# Patient Record
Sex: Female | Born: 1970
Health system: Southern US, Community
[De-identification: ages and names within clinical notes are randomized; demographics above are authoritative.]

## PROBLEM LIST (undated history)

## (undated) DIAGNOSIS — E039 Hypothyroidism, unspecified: Secondary | ICD-10-CM

## (undated) DIAGNOSIS — O039 Complete or unspecified spontaneous abortion without complication: Secondary | ICD-10-CM

## (undated) DIAGNOSIS — K759 Inflammatory liver disease, unspecified: Secondary | ICD-10-CM

## (undated) HISTORY — PX: WISDOM TOOTH EXTRACTION: SHX21

---

## 2011-05-24 ENCOUNTER — Other Ambulatory Visit (HOSPITAL_COMMUNITY): Payer: Self-pay | Admitting: Obstetrics and Gynecology

## 2011-05-24 ENCOUNTER — Ambulatory Visit (HOSPITAL_COMMUNITY)
Admission: RE | Admit: 2011-05-24 | Discharge: 2011-05-24 | Disposition: A | Discharge status: Home / Self Care | Payer: Commercial Managed Care - PPO | Source: Ambulatory Visit | Attending: Obstetrics and Gynecology | Admitting: Obstetrics and Gynecology

## 2011-05-24 ENCOUNTER — Encounter (HOSPITAL_COMMUNITY): Payer: Self-pay

## 2011-05-24 DIAGNOSIS — O2690 Pregnancy related conditions, unspecified, unspecified trimester: Secondary | ICD-10-CM

## 2011-05-24 DIAGNOSIS — O358XX0 Maternal care for other (suspected) fetal abnormality and damage, not applicable or unspecified: Secondary | ICD-10-CM

## 2011-05-24 DIAGNOSIS — O34219 Maternal care for unspecified type scar from previous cesarean delivery: Secondary | ICD-10-CM | POA: Insufficient documentation

## 2011-05-24 DIAGNOSIS — E079 Disorder of thyroid, unspecified: Secondary | ICD-10-CM | POA: Insufficient documentation

## 2011-05-24 DIAGNOSIS — O99280 Endocrine, nutritional and metabolic diseases complicating pregnancy, unspecified trimester: Secondary | ICD-10-CM | POA: Insufficient documentation

## 2011-05-24 DIAGNOSIS — O09529 Supervision of elderly multigravida, unspecified trimester: Secondary | ICD-10-CM | POA: Insufficient documentation

## 2011-06-08 NOTE — H&P (Signed)
40 year old G4 P58 at 46 w3 d presents for D and E with chromosones. This patient had an abnormal first trimester ultrasound.  A large omphalocele, cystic hygroma, pleural effusions, and hydrops was noted. The patient had an ultrasound with MFM which confirmed the findings. CVS was offered but the patient elected for termination after extensive counselling by MFM.  The patient and her husband have chosen to have tissue sent for karyotype.  Med hx Unremarkable  surg hx Cesarean section x 2  meds  None All Codeine  Af vss gen alert and oriented Lung CTAb Car RRR Abd soft and non tender Cervix closed 1 laminaria placed  IMP IUP at 14  Multiple anomalies  PLAN D and E Ultrasound guidance Karyotype Risks of uterineperforation, retained tissue discussed with the patient

## 2011-06-09 ENCOUNTER — Encounter (HOSPITAL_COMMUNITY): Payer: Self-pay | Admitting: Anesthesiology

## 2011-06-09 ENCOUNTER — Encounter (HOSPITAL_COMMUNITY): Payer: Self-pay | Admitting: *Deleted

## 2011-06-09 ENCOUNTER — Encounter (HOSPITAL_COMMUNITY)
Admission: RE | Disposition: A | Discharge status: Home / Self Care | Payer: Self-pay | Source: Ambulatory Visit | Attending: Obstetrics and Gynecology

## 2011-06-09 ENCOUNTER — Ambulatory Visit (HOSPITAL_COMMUNITY): Payer: 59 | Admitting: Anesthesiology

## 2011-06-09 ENCOUNTER — Other Ambulatory Visit: Payer: Self-pay | Admitting: Obstetrics and Gynecology

## 2011-06-09 ENCOUNTER — Ambulatory Visit (HOSPITAL_COMMUNITY)
Admission: RE | Admit: 2011-06-09 | Discharge: 2011-06-09 | Disposition: A | Discharge status: Home / Self Care | Payer: 59 | Source: Ambulatory Visit | Attending: Obstetrics and Gynecology | Admitting: Obstetrics and Gynecology

## 2011-06-09 ENCOUNTER — Ambulatory Visit (HOSPITAL_COMMUNITY): Payer: 59

## 2011-06-09 DIAGNOSIS — O039 Complete or unspecified spontaneous abortion without complication: Secondary | ICD-10-CM | POA: Insufficient documentation

## 2011-06-09 DIAGNOSIS — IMO0002 Reserved for concepts with insufficient information to code with codable children: Secondary | ICD-10-CM

## 2011-06-09 DIAGNOSIS — O351XX0 Maternal care for (suspected) chromosomal abnormality in fetus, not applicable or unspecified: Secondary | ICD-10-CM | POA: Insufficient documentation

## 2011-06-09 DIAGNOSIS — O3510X0 Maternal care for (suspected) chromosomal abnormality in fetus, unspecified, not applicable or unspecified: Secondary | ICD-10-CM | POA: Insufficient documentation

## 2011-06-09 HISTORY — PX: DILATION AND EVACUATION: SHX1459

## 2011-06-09 HISTORY — DX: Hypothyroidism, unspecified: E03.9

## 2011-06-09 HISTORY — DX: Inflammatory liver disease, unspecified: K75.9

## 2011-06-09 HISTORY — DX: Complete or unspecified spontaneous abortion without complication: O03.9

## 2011-06-09 LAB — CBC
Hemoglobin: 12 g/dL (ref 12.0–15.0)
MCH: 26 pg (ref 26.0–34.0)
MCHC: 32.4 g/dL (ref 30.0–36.0)
RDW: 15.1 % (ref 11.5–15.5)

## 2011-06-09 SURGERY — DILATION AND EVACUATION, UTERUS, SECOND TRIMESTER
Anesthesia: Choice | Site: Vagina | Wound class: Clean Contaminated

## 2011-06-09 MED ORDER — PROPOFOL 10 MG/ML IV EMUL
INTRAVENOUS | Status: AC
  Filled 2011-06-09: qty 20

## 2011-06-09 MED ORDER — LIDOCAINE HCL (CARDIAC) 20 MG/ML IV SOLN
INTRAVENOUS | Status: AC
  Filled 2011-06-09: qty 5

## 2011-06-09 MED ORDER — LACTATED RINGERS IV SOLN
INTRAVENOUS | Status: DC
  Administered 2011-06-09: 1000 mL via INTRAVENOUS
  Administered 2011-06-09: 07:00:00 via INTRAVENOUS

## 2011-06-09 MED ORDER — ONDANSETRON HCL 4 MG/2ML IJ SOLN: 4.0000 mg | Freq: Once | INTRAMUSCULAR | Status: DC | PRN

## 2011-06-09 MED ORDER — FENTANYL CITRATE 0.05 MG/ML IJ SOLN
25.0000 ug | INTRAMUSCULAR | Status: DC | PRN
  Administered 2011-06-09: 50 ug via INTRAVENOUS

## 2011-06-09 MED ORDER — CEFAZOLIN SODIUM 1-5 GM-% IV SOLN
INTRAVENOUS | Status: AC
  Filled 2011-06-09: qty 50

## 2011-06-09 MED ORDER — ONDANSETRON HCL 4 MG/2ML IJ SOLN
INTRAMUSCULAR | Status: DC | PRN
  Administered 2011-06-09: 4 mg via INTRAVENOUS

## 2011-06-09 MED ORDER — MIDAZOLAM HCL 5 MG/5ML IJ SOLN
INTRAMUSCULAR | Status: DC | PRN
  Administered 2011-06-09: 2 mg via INTRAVENOUS

## 2011-06-09 MED ORDER — PROPOFOL 10 MG/ML IV EMUL
INTRAVENOUS | Status: DC | PRN
  Administered 2011-06-09 (×8): 20 mg via INTRAVENOUS

## 2011-06-09 MED ORDER — MEPERIDINE HCL 25 MG/ML IJ SOLN: 6.2500 mg | INTRAMUSCULAR | Status: DC | PRN

## 2011-06-09 MED ORDER — LIDOCAINE HCL (CARDIAC) 20 MG/ML IV SOLN
INTRAVENOUS | Status: DC | PRN
  Administered 2011-06-09: 50 mg via INTRAVENOUS

## 2011-06-09 MED ORDER — CEFAZOLIN SODIUM 1-5 GM-% IV SOLN
1.0000 g | INTRAVENOUS | Status: AC
  Administered 2011-06-09: 1 g via INTRAVENOUS

## 2011-06-09 MED ORDER — LIDOCAINE HCL 1 % IJ SOLN
INTRAMUSCULAR | Status: DC | PRN
  Administered 2011-06-09: 10 mL

## 2011-06-09 MED ORDER — FENTANYL CITRATE 0.05 MG/ML IJ SOLN
INTRAMUSCULAR | Status: AC
  Filled 2011-06-09: qty 2

## 2011-06-09 MED ORDER — ONDANSETRON 4 MG PO TBDP
4.0000 mg | ORAL_TABLET | Freq: Once | ORAL | Status: AC
  Administered 2011-06-09: 4 mg via ORAL

## 2011-06-09 MED ORDER — ACETAMINOPHEN 325 MG PO TABS: 325.0000 mg | ORAL_TABLET | ORAL | Status: DC | PRN

## 2011-06-09 MED ORDER — ONDANSETRON HCL 4 MG/2ML IJ SOLN
INTRAMUSCULAR | Status: AC
  Filled 2011-06-09: qty 2

## 2011-06-09 MED ORDER — ONDANSETRON 4 MG PO TBDP
ORAL_TABLET | ORAL | Status: AC
  Filled 2011-06-09: qty 1

## 2011-06-09 MED ORDER — FENTANYL CITRATE 0.05 MG/ML IJ SOLN
INTRAMUSCULAR | Status: DC | PRN
  Administered 2011-06-09 (×2): 25 ug via INTRAVENOUS
  Administered 2011-06-09: 50 ug via INTRAVENOUS

## 2011-06-09 MED ORDER — MIDAZOLAM HCL 2 MG/2ML IJ SOLN
INTRAMUSCULAR | Status: AC
  Filled 2011-06-09: qty 2

## 2011-06-09 SURGICAL SUPPLY — 16 items
CLOTH BEACON ORANGE TIMEOUT ST (SAFETY) ×2 IMPLANT
CONT PATH 16OZ SNAP LID 3702 (MISCELLANEOUS) IMPLANT
DRAPE HYSTEROSCOPY (DRAPE) ×2 IMPLANT
GLOVE BIO SURGEON STRL SZ 6.5 (GLOVE) ×4 IMPLANT
GOWN STRL NON-REIN LRG LVL3 (GOWN DISPOSABLE) ×4 IMPLANT
KIT BERKELEY 2ND TRIMESTER 1/2 (COLLECTOR) ×2 IMPLANT
NEEDLE SPNL 22GX3.5 QUINCKE BK (NEEDLE) ×2 IMPLANT
NS IRRIG 1000ML POUR BTL (IV SOLUTION) ×2 IMPLANT
PACK VAGINAL MINOR WOMEN LF (CUSTOM PROCEDURE TRAY) ×2 IMPLANT
PAD PREP 24X48 CUFFED NSTRL (MISCELLANEOUS) ×2 IMPLANT
SYR CONTROL 10ML LL (SYRINGE) ×2 IMPLANT
TOWEL OR 17X24 6PK STRL BLUE (TOWEL DISPOSABLE) ×4 IMPLANT
TUBE VACURETTE 2ND TRIMESTER (CANNULA) ×2 IMPLANT
VACURETTE 12 RIGID CVD (CANNULA) ×2 IMPLANT
VACURETTE 14MM CVD 1/2 BASE (CANNULA) IMPLANT
VACURETTE 16MM ASPIR CVD .5 (CANNULA) IMPLANT

## 2011-06-09 NOTE — Transfer of Care (Signed)
Immediate Anesthesia Transfer of Care Note  Patient: Abigail Goodwin  Procedure(s) Performed:  DILATATION AND EVACUATION (D&E) 2ND TRIMESTER - pt is 13 weeks/ultrasound and chromosomes  Patient Location: PACU  Anesthesia Type: MAC  Level of Consciousness: awake, alert  and sedated  Airway & Oxygen Therapy: Patient Spontanous Breathing  Post-op Assessment: Report given to PACU RN and Post -op Vital signs reviewed and stable  Post vital signs: Reviewed and stable  Complications: No apparent anesthesia complications

## 2011-06-09 NOTE — Brief Op Note (Signed)
06/09/2011  7:49 AM  PATIENT:  Abigail Goodwin  40 y.o. female  PRE-OPERATIVE DIAGNOSIS:   1. Cystic Hygroma 2. Omphalocele 3. Pleural effusions 4. Hydrops 5. IUP at 14 weeks 6. Previous c-section x 2  POST-OPERATIVE DIAGNOSIS:   1. Same 2. IUFD  PROCEDURE:  Procedure(s): DILATATION AND EVACUATION (D&E) 2ND TRIMESTER With ultrasound guidance Karyotype  SURGEON:  Surgeon(s): Jeani Hawking, MD  PHYSICIAN ASSISTANT:   ASSISTANTS: none   ANESTHESIA:   LMA  OR FLUID I/O:  Total I/O In: 800 [I.V.:800] Out: 125 [Urine:100; Blood:25]  BLOOD ADMINISTERED:none  DRAINS: none   LOCAL MEDICATIONS USED:  MARCAINE 10 CC  SPECIMEN:  Source of Specimen:  POC  DISPOSITION OF SPECIMEN:  PATHOLOGY  COUNTS:  YES  TOURNIQUET:  * No tourniquets in log *  DICTATION: .Other Dictation: Dictation Number [id number]  PLAN OF CARE: Discharge to home after PACU  PATIENT DISPOSITION:  PACU - hemodynamically stable.   Delay start of Pharmacological VTE agent (>24hrs) due to surgical blood loss or risk of bleeding:  not applicable

## 2011-06-09 NOTE — Anesthesia Preprocedure Evaluation (Addendum)
Anesthesia Evaluation Anesthesia Physical Anesthesia Plan  ASA: II  Anesthesia Plan: MAC   Post-op Pain Management:    Induction: Intravenous  Airway Management Planned: Oral ETT  Additional Equipment:   Intra-op Plan:   Post-operative Plan:   Informed Consent: I have reviewed the patients History and Physical, chart, labs and discussed the procedure including the risks, benefits and alternatives for the proposed anesthesia with the patient or authorized representative who has indicated his/her understanding and acceptance.   Dental Advisory Given  Plan Discussed with: CRNA and Surgeon  Anesthesia Plan Comments: (  Discussed  general anesthesia, including possible nausea, instrumentation of airway, sore throat,pulmonary aspiration, etc. I asked if the were any outstanding questions, or  concerns before we proceeded. )        Anesthesia Quick Evaluation

## 2011-06-09 NOTE — Op Note (Signed)
NAME:  Abigail Goodwin, Abigail Goodwin NO.:  [id number]  MEDICAL RECORD NO.:  [id number]  LOCATION:  [facility]                          [facility]ACILITY:  [facility]  PHYSICIAN:  Zebulan Hinshaw L. Kele Withem, M.D.DATE OF BIRTH:  1971/05/08  DATE OF PROCEDURE: DATE OF DISCHARGE:                              OPERATIVE REPORT   PREOPERATIVE DIAGNOSES: 1. Intrauterine pregnancy at 14 weeks. 2. Cystic hygroma. 3. Omphalocele. 4. Pleural effusions. 5. Hydrops. 6. Previous cesarean section x2.  PREOPERATIVE DIAGNOSES: 1. Intrauterine pregnancy at 14 weeks. 2. Intrauterine fetal death. 3. Cystic hygroma. 4. Omphalocele. 5. Pleural effusions. 6. Hydrops. 7. Previous cesarean section x2.  PROCEDURE:  Dilatation and evacuation in second trimester with ultrasound guidance and tissue sent for karyotype.  SURGEON:  Paolo Okane L. Vincente Poli, MD  ANESTHESIA:  LMA.  ESTIMATED BLOOD LOSS:  Approximately 200 mL.  PROCEDURE:  The patient was taken to the operating room.  Ultrasound was present from start to the end of the case.  She was prepped and draped in usual sterile fashion.  In-and-out catheter was used to empty the bladder, speculum was inserted into the vagina.  Prior to prepping, we did remove one sponge and one laminaria.  Her cervix was already 1 cm dilated from the laminaria that I placed yesterday.  A paracervical block was performed in a standard fashion.  Using the ultrasound, we first looked at the fetus.  There was no heart beat consistent with an IUFD.  I then slowly dilated under ultrasound guidance.  It was very easy because the cervix was soft and already dilated from the laminaria placed.  We then inserted a #12 suction cannula and performed a suction curettage.  Everything was done directly with ultrasound guidance.  All tissue was then removed except for the baby's skull.  I then used the forceps, I was able to decompress the skull and then reinserted the suction cannula and performed a  suction curettage.  All tissue was then noted to be removed.  A sharp curette was inserted, the uterus was thoroughly curetted of all tissue and the endometrium was clean.  The cavity was clean.  No bleeding was noted.  All instruments were removed from the vagina.  All sponge, lap, and instrument counts were correct x2.  She will go to recovery room in stable condition.     Belicia Difatta L. Vincente Poli, M.D.     Florestine Avers  D:  06/09/2011  T:  06/09/2011  Job:  [id number]

## 2011-06-09 NOTE — Progress Notes (Signed)
H and P on chart No significant changes Laminaria remained in place overnight. Will proceed with D and E with ultrasound guidance / chromosone analysis

## 2011-06-09 NOTE — Anesthesia Postprocedure Evaluation (Signed)
Anesthesia Post Note  Patient: Abigail Goodwin  Procedure(s) Performed:  DILATATION AND EVACUATION (D&E) 2ND TRIMESTER - 14 weeks/ultrasound and chromosome studies  Anesthesia type: MAC  Patient location: PACU  Post pain: Pain level controlled  Post assessment: Post-op Vital signs reviewed  Last Vitals:  Filed Vitals:   06/09/11 0750  BP: 111/40  Pulse: 84  Temp: 97.6 F (36.4 C)  Resp: 20    Post vital signs: Reviewed  Level of consciousness: sedated  Complications: No apparent anesthesia complications

## 2011-06-15 ENCOUNTER — Encounter (HOSPITAL_COMMUNITY): Payer: Self-pay | Admitting: Obstetrics and Gynecology

## 2011-08-12 ENCOUNTER — Inpatient Hospital Stay (HOSPITAL_COMMUNITY): Payer: 59

## 2011-08-12 ENCOUNTER — Other Ambulatory Visit: Payer: Self-pay | Admitting: Obstetrics and Gynecology

## 2011-08-12 ENCOUNTER — Encounter (HOSPITAL_COMMUNITY): Payer: Self-pay

## 2011-08-12 ENCOUNTER — Inpatient Hospital Stay (HOSPITAL_COMMUNITY)
Admission: AD | Admit: 2011-08-12 | Discharge: 2011-08-12 | Disposition: A | Discharge status: Home / Self Care | Payer: 59 | Source: Ambulatory Visit | Attending: Obstetrics and Gynecology | Admitting: Obstetrics and Gynecology

## 2011-08-12 DIAGNOSIS — N949 Unspecified condition associated with female genital organs and menstrual cycle: Secondary | ICD-10-CM | POA: Insufficient documentation

## 2011-08-12 DIAGNOSIS — N938 Other specified abnormal uterine and vaginal bleeding: Secondary | ICD-10-CM | POA: Insufficient documentation

## 2011-08-12 DIAGNOSIS — R109 Unspecified abdominal pain: Secondary | ICD-10-CM | POA: Insufficient documentation

## 2011-08-12 LAB — CBC
HCT: 40.6 % (ref 36.0–46.0)
Hemoglobin: 13.7 g/dL (ref 12.0–15.0)
MCV: 80.9 fL (ref 78.0–100.0)
RBC: 5.02 MIL/uL (ref 3.87–5.11)
WBC: 13.7 10*3/uL — ABNORMAL HIGH (ref 4.0–10.5)

## 2011-08-12 LAB — HCG, QUANTITATIVE, PREGNANCY: hCG, Beta Chain, Quant, S: <1 m[IU]/mL (ref <5)

## 2011-08-12 MED ORDER — ACETAMINOPHEN 325 MG PO TABS: 650.0000 mg | ORAL_TABLET | Freq: Four times a day (QID) | ORAL | Status: DC | PRN

## 2011-08-12 MED ORDER — AMOXICILLIN-POT CLAVULANATE 875-125 MG PO TABS: 1.0000 | ORAL_TABLET | Freq: Two times a day (BID) | ORAL | Status: AC

## 2011-08-12 NOTE — ED Provider Notes (Signed)
History   40yo G4 P2 with LMP now and presents c/o increased cramping yesterday and bleeding and passed some "tissue".   She reports bleeding that started on 11/17 for 2 days and then it restarted.  She denies any other symptoms.  Chief Complaint  Patient presents with  . Abdominal Cramping  . Vaginal Bleeding   HPI  OB History    Grav Para Term Preterm Abortions TAB SAB Ect Mult Living   4 2 2  0 1 0 1 0 0 2      Past Medical History  Diagnosis Date  . Hypothyroidism   . Miscarriage   . Hepatitis     hx at age 34yrs old    Past Surgical History  Procedure Date  . Cesarean section     x2  . Wisdom tooth extraction   . Dilation and evacuation 06/09/2011    Procedure: DILATATION AND EVACUATION (D&E) 2ND TRIMESTER;  Surgeon: Jeani Hawking, MD;  Location: [facility];  Service: Gynecology;  Laterality: N/A;  14 weeks/ultrasound and chromosome studies    No family history on file.  History  Substance Use Topics  . Smoking status: Never Smoker   . Smokeless tobacco: Never Used  . Alcohol Use: No    Allergies:  Allergies  Allergen Reactions  . Advil Swelling    Swelling/rash on face    Prescriptions prior to admission  Medication Sig Dispense Refill  . Levothyroxine Sodium (SYNTHROID PO) Take 1 tablet by mouth daily.       Marland Kitchen oxyCODONE-acetaminophen (PERCOCET) 5-325 MG per tablet Take 1 tablet by mouth every 4 (four) hours as needed. For pain.      . prenatal vitamin w/FE, FA (PRENATAL 1 + 1) 27-1 MG TABS Take 1 tablet by mouth daily.        Marland Kitchen DISCONTD: acetaminophen (TYLENOL) 325 MG tablet Take 650 mg by mouth every 6 (six) hours as needed. For pain.         ROS Cough and recent URI.  Denies fever, chills, n/v.  Physical Exam   Blood pressure 108/74, pulse 72, temperature 98.6 F (37 C), temperature source Oral, resp. rate 16, height 5' 2.5" (1.588 m), weight 70.308 kg (155 lb), last menstrual period 08/12/2011, unknown if currently breastfeeding.  Physical  Exam HEENT throat no erythema and TMs wnl Lungs CTA bil CV RRR Abd soft, NT Pelvic Exam: cervix closed and long, minimal bleeding, uterus wnl and nontender and no palpable adnexal masses, nt  MAU Course  Procedures Ultrasound - negative with normal endometrium or signs of Retained POCs CBC Quant Tissue sent to path and to [facility] for chromosome analysis   Assessment and Plan  40yo P2 s/p D&C end of September with recent increased cramping and abnormal bleeding and possible passing of tissue vs clot.  No evidence of retained POCs on ultrasound or exam. WBC is elevated but no obvious signs of endometritis.  D/w pt and she feels comfortable taking prescription of antibiotic as a precaution. Rx Augmentin given Precautions reviewed F/u in office in 4 weeks - by that time the chromosome results will have returned  Ariyona Eid Y 08/12/2011, 1:18 PM

## 2011-08-12 NOTE — Progress Notes (Signed)
Onset of abdominal cramping last night  Had D&D on 9/21 this morning passed tissue ?embryo patient brought with her.

## 2011-08-12 NOTE — ED Notes (Signed)
Dr. Su Hilt called to clarify orders. Pt states specimen she brought from home was to be send for chromosome analysis. Dr. Su Hilt clarified orders and states to send small portion of specimen in formalin to lab for products  of conception. Large portion of specimen to be sent for Chromosome analysis. Abigail Goodwin to lab to clarify orders; formalin added to small specimen and large specimen added to saline container provided by the lab tech. Form filled out of chromosome specimen and signed by patient. Dr. Su Hilt notified that specimen could not be send until signed by physician. Dr. Su Hilt aware and states she will be by to sign the form.

## 2011-08-12 NOTE — ED Notes (Signed)
Dr. Su Hilt on unit. Signed form for Chromosomal analysis. Pt in [place] will notify Dr. Su Hilt when she returns.

## 2012-10-21 IMAGING — US US OB COMP LESS 14 WK
1 series · 14 of 28 positions shown · non-contrast
Comparison: none

[Series 1: us ob comp less 14 wk · 0.17mm/px · 14 of 74 slices shown]
[im 3/74]
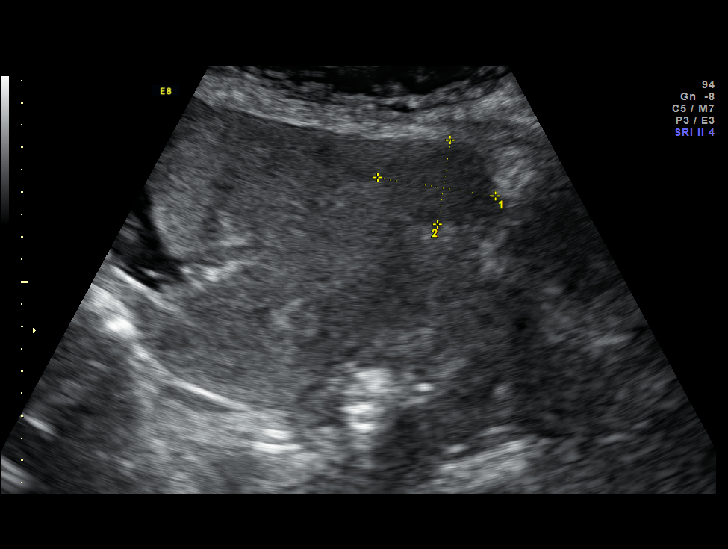
[im 9/74]
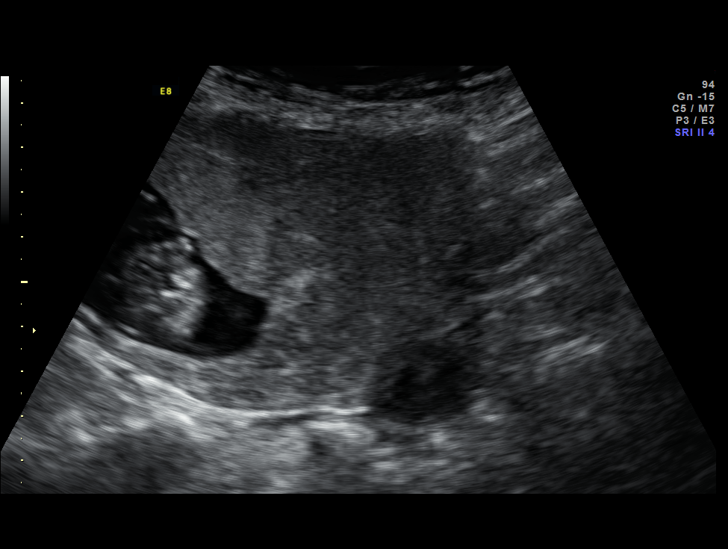
[im 14/74]
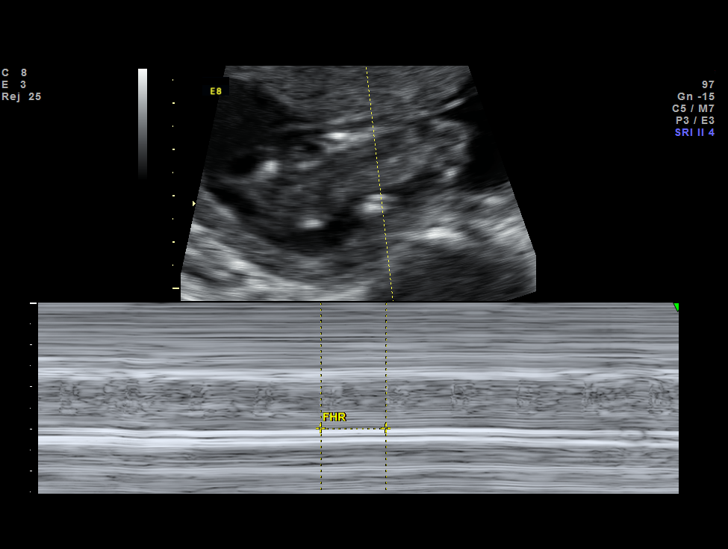
[im 19/74]
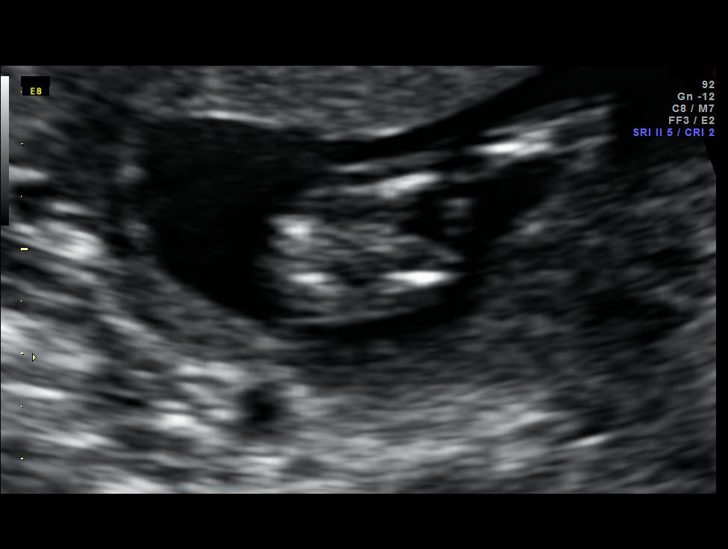
[im 25/74]
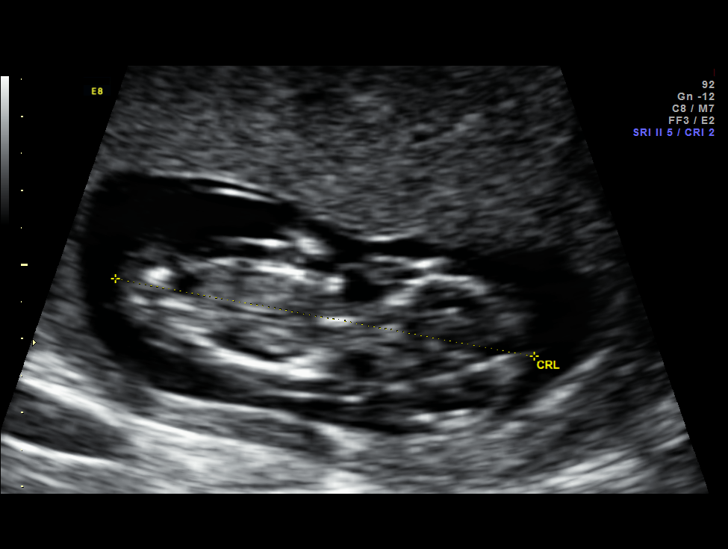
[im 30/74]
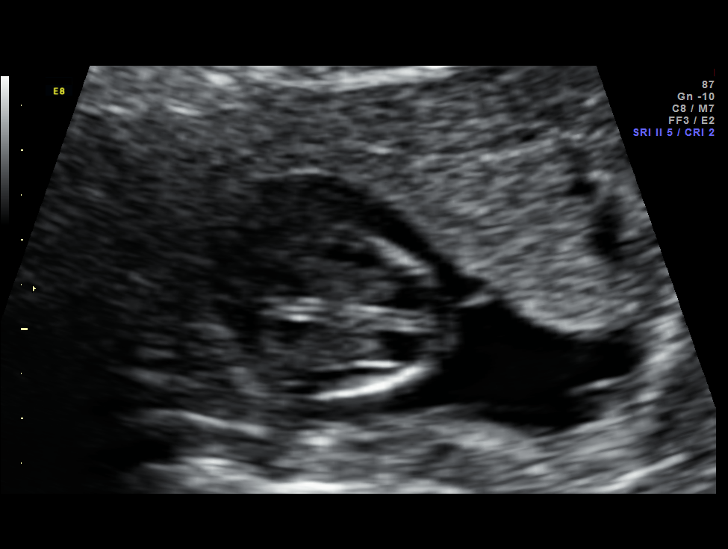
[im 36/74]
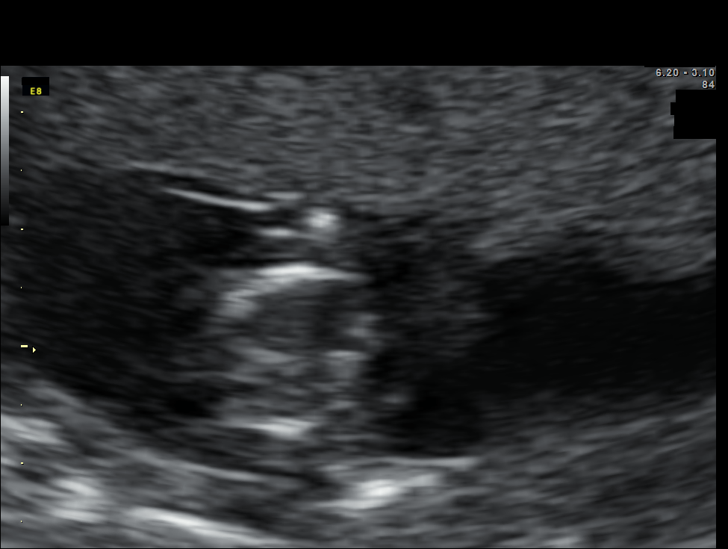
[im 41/74]
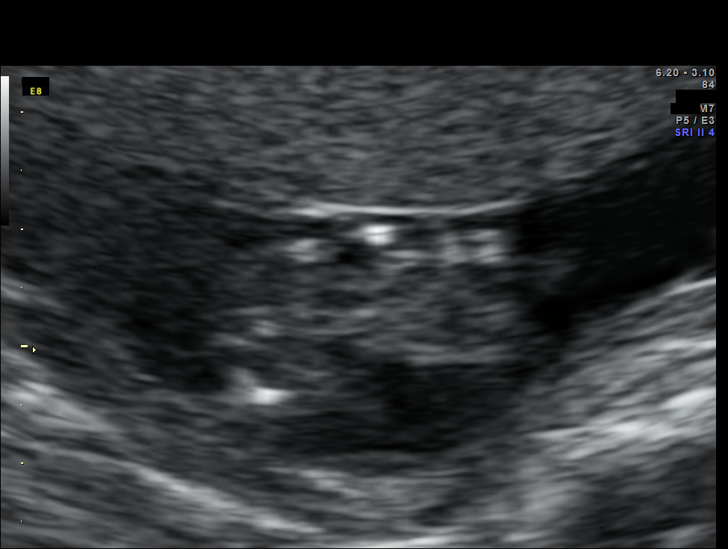
[im 46/74]
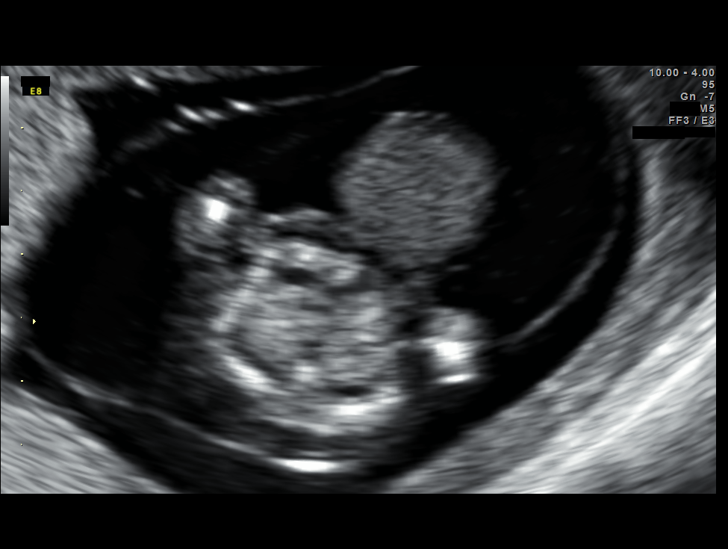
[im 52/74]
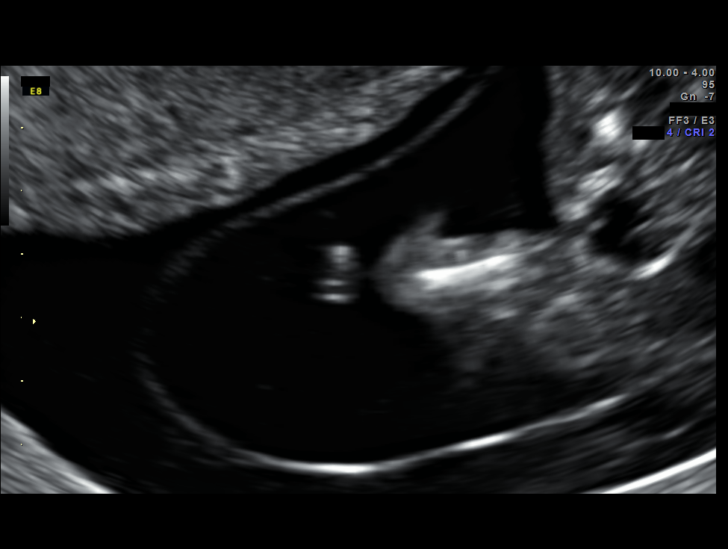
[im 57/74]
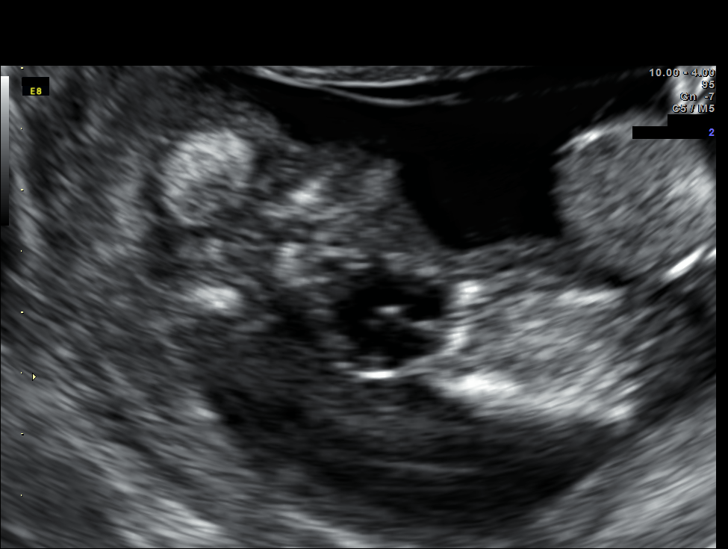
[im 63/74]
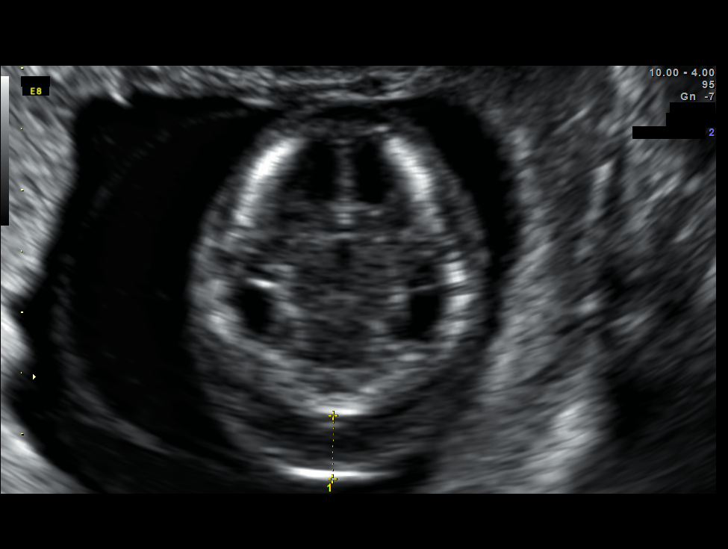
[im 68/74]
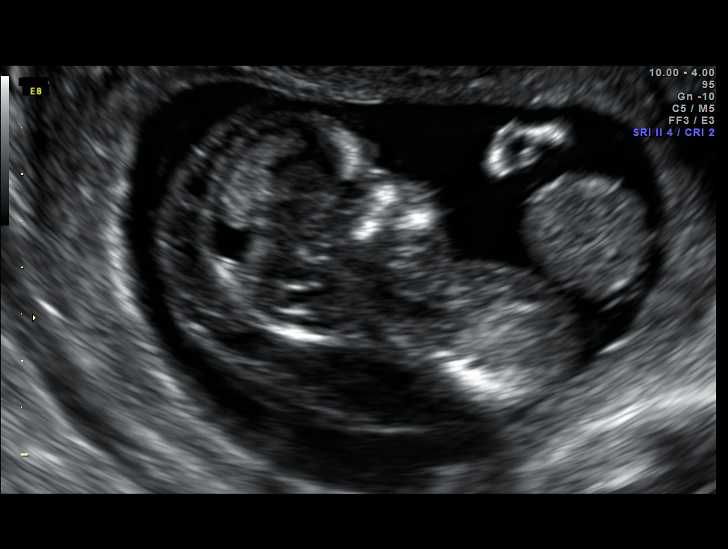
[im 74/74]
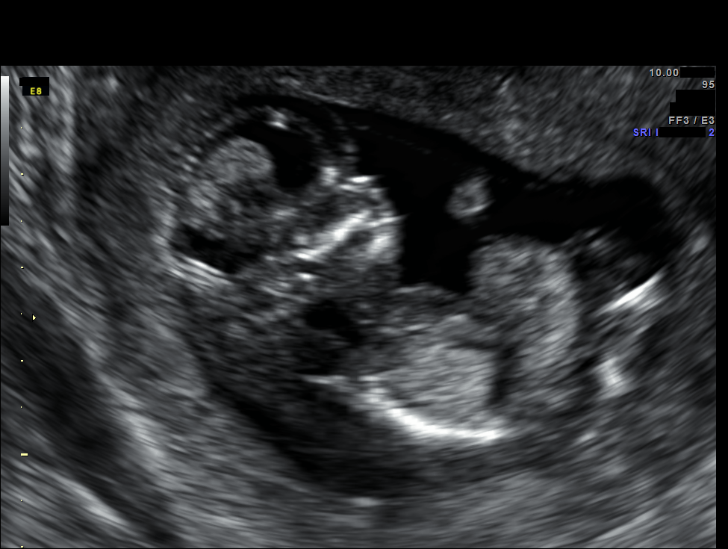

[14 of 28 positions shown; findings below may reference images not displayed]

Canned report from images found in remote index.

Refer to host system for actual result text.

## 2012-11-06 IMAGING — US US INTRAOPERATIVE - NO REPORT
1 series · 1 of 1 positions shown · non-contrast
Comparison: none

[Series 1: us intraoperative · 1 of 151 frames shown]
[frame 76/151]
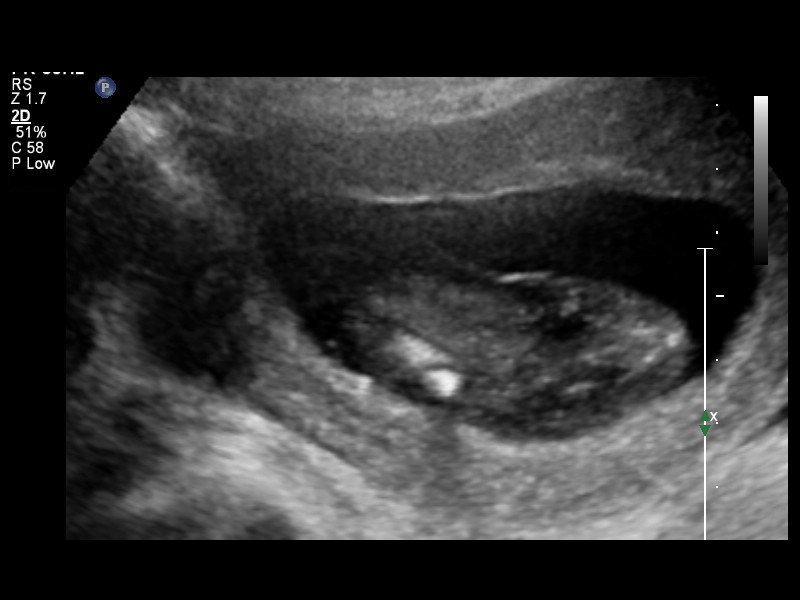

[1 of 1 positions shown; findings below may reference images not displayed]

Canned report from images found in remote index.

Refer to host system for actual result text.

## 2012-11-27 ENCOUNTER — Ambulatory Visit (INDEPENDENT_AMBULATORY_CARE_PROVIDER_SITE_OTHER): Payer: 59 | Admitting: Emergency Medicine

## 2012-11-27 ENCOUNTER — Other Ambulatory Visit: Payer: Self-pay | Admitting: Emergency Medicine

## 2012-11-27 VITALS — BP 120/60 | HR 75 | Temp 98.2°F | Resp 18 | Ht 62.0 in | Wt 153.0 lb

## 2012-11-27 DIAGNOSIS — Z8759 Personal history of other complications of pregnancy, childbirth and the puerperium: Secondary | ICD-10-CM

## 2012-11-27 DIAGNOSIS — E039 Hypothyroidism, unspecified: Secondary | ICD-10-CM

## 2012-11-27 DIAGNOSIS — Z8742 Personal history of other diseases of the female genital tract: Secondary | ICD-10-CM

## 2012-11-27 LAB — POCT CBC
Lymph, poc: 1.9 (ref 0.6–3.4)
MCH, POC: 26.7 pg — AB (ref 27–31.2)
MCHC: 31.1 g/dL — AB (ref 31.8–35.4)
MCV: 85.9 fL (ref 80–97)
MID (cbc): 0.4 (ref 0–0.9)
MPV: 8.1 fL (ref 0–99.8)
POC LYMPH PERCENT: 17.9 %L (ref 10–50)
POC MID %: 4.2 %M (ref 0–12)
Platelet Count, POC: 394 10*3/uL (ref 142–424)
WBC: 10.5 10*3/uL — AB (ref 4.6–10.2)

## 2012-11-27 LAB — COMPREHENSIVE METABOLIC PANEL
AST: 24 U/L (ref 0–37)
Albumin: 4.5 g/dL (ref 3.5–5.2)
BUN: 9 mg/dL (ref 6–23)
Calcium: 9.6 mg/dL (ref 8.4–10.5)
Chloride: 102 mEq/L (ref 96–112)
Glucose, Bld: 84 mg/dL (ref 70–99)
Potassium: 4.5 mEq/L (ref 3.5–5.3)
Sodium: 139 mEq/L (ref 135–145)
Total Protein: 7.9 g/dL (ref 6.0–8.3)

## 2012-11-27 MED ORDER — LEVOTHYROXINE SODIUM 112 MCG PO TABS: 112.0000 ug | ORAL_TABLET | Freq: Every day | ORAL | Status: DC

## 2012-11-27 NOTE — Progress Notes (Signed)
  Subjective:    Patient ID: Abigail Goodwin, female    DOB: 09/22/1970, 42 y.o.   MRN: [id number]  HPI Here for blood work and refill on thyroid med. Had 2 miscarriages  in the past two years. DNC done in Aug of last year. OBGYN recommended to wait one year. Has a 40yr old and 40yr old.     Review of Systems     Objective:   Physical Exam HEENT exam is unremarkable. Neck exam reveals a symmetrically enlarged thyroid chest is clear to heart regular rate no murmurs. Abdomen soft nontender.  Results for orders placed in visit on 11/27/12  POCT CBC      Result Value Range   WBC 10.5 (*) 4.6 - 10.2 K/uL   Lymph, poc 1.9  0.6 - 3.4   POC LYMPH PERCENT 17.9  10 - 50 %L   MID (cbc) 0.4  0 - 0.9   POC MID % 4.2  0 - 12 %M   POC Granulocyte 8.2 (*) 2 - 6.9   Granulocyte percent 77.9  37 - 80 %G   RBC 4.68  4.04 - 5.48 M/uL   Hemoglobin 12.5  12.2 - 16.2 g/dL   HCT, POC 40.9  81.1 - 47.9 %   MCV 85.9  80 - 97 fL   MCH, POC 26.7 (*) 27 - 31.2 pg   MCHC 31.1 (*) 31.8 - 35.4 g/dL   RDW, POC 91.4     Platelet Count, POC 394  142 - 424 K/uL   MPV 8.1  0 - 99.8 fL       Assessment & Plan:  Synthroid was refilled. Screening labs were done because of patient's history of 2 recent miscarriages.

## 2012-11-28 DIAGNOSIS — E039 Hypothyroidism, unspecified: Secondary | ICD-10-CM | POA: Insufficient documentation

## 2012-11-29 LAB — HEPATITIS PANEL, ACUTE
Hep A IgM: NEGATIVE
Hep B C IgM: NEGATIVE

## 2013-01-09 IMAGING — US US PELVIS COMPLETE
1 series · 14 of 25 positions shown · non-contrast
Comparison: None.

CLINICAL DATA: Retained products of conception.

TRANSABDOMINAL AND TRANSVAGINAL ULTRASOUND OF PELVIS
TECHNIQUE: Both transabdominal and transvaginal ultrasound
examinations of the pelvis were performed. Transabdominal technique
was performed for global imaging of the pelvis including uterus,
ovaries, adnexal regions, and pelvic cul-de-sac.

[Series 1: us pelvis complete · 14 of 44 slices shown]
[im 1/44]
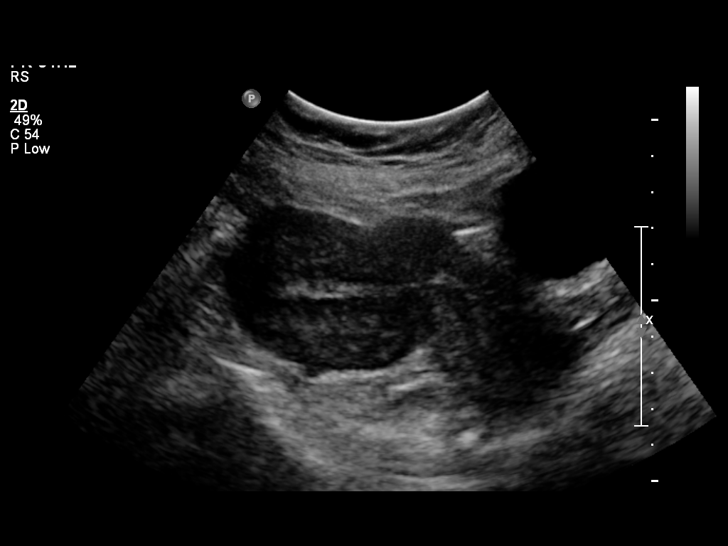
[im 4/44]
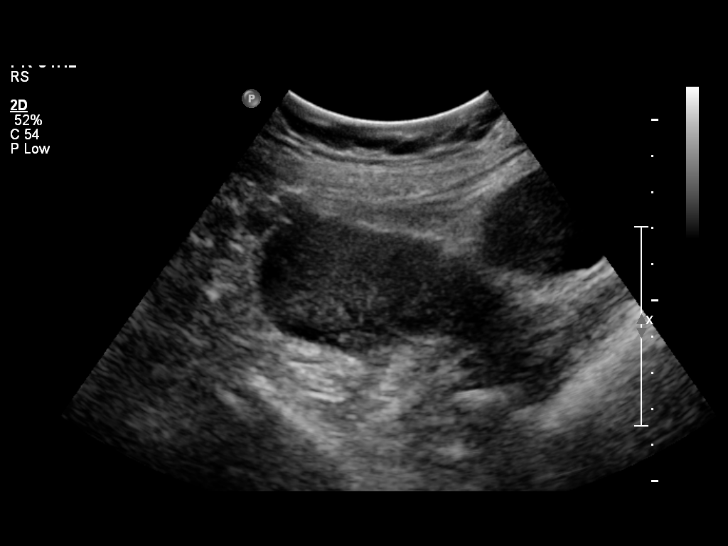
[im 8/44]
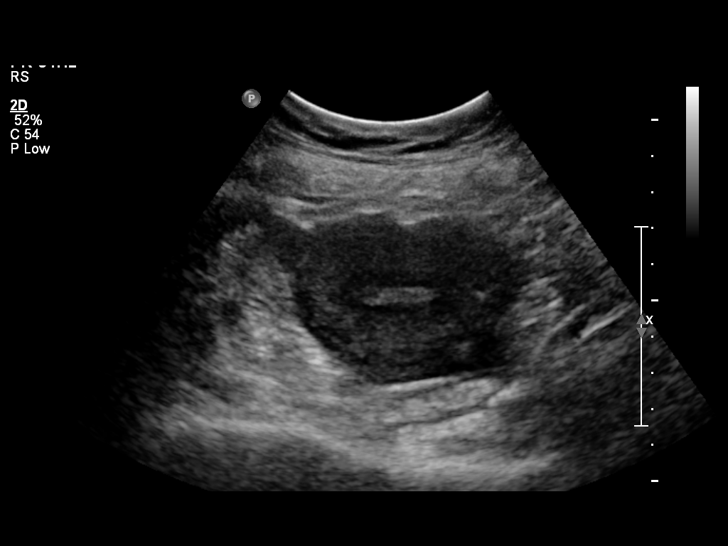
[im 11/44]
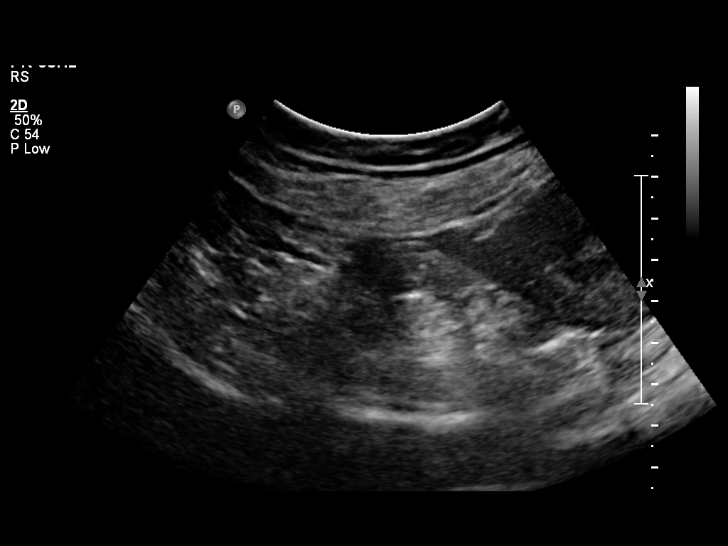
[im 15/44]
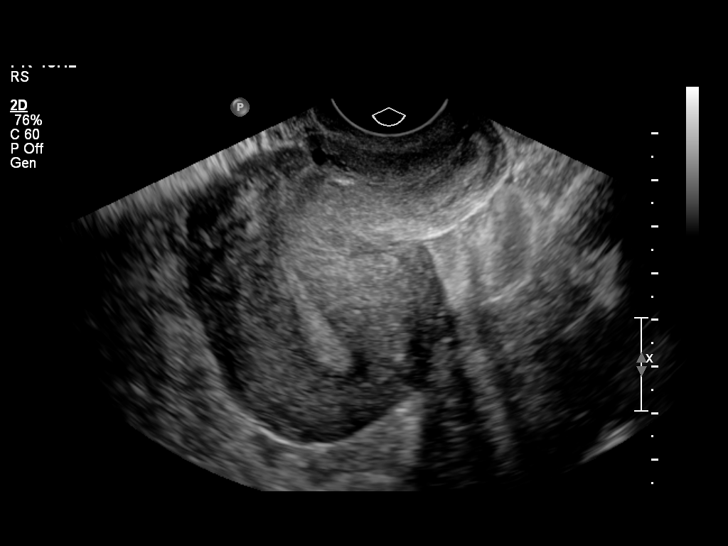
[im 17/44]
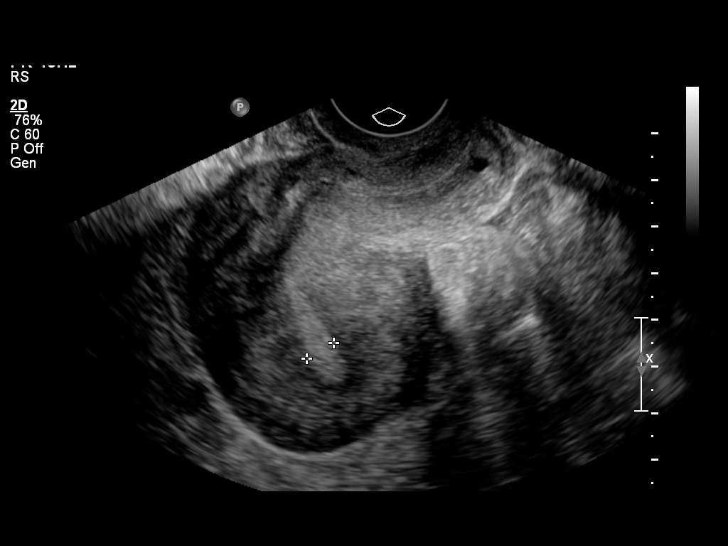
[im 20/44]
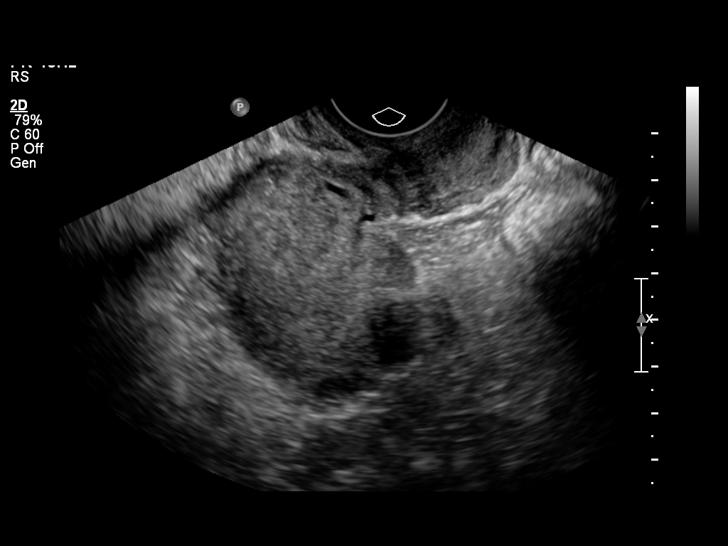
[im 24/44]
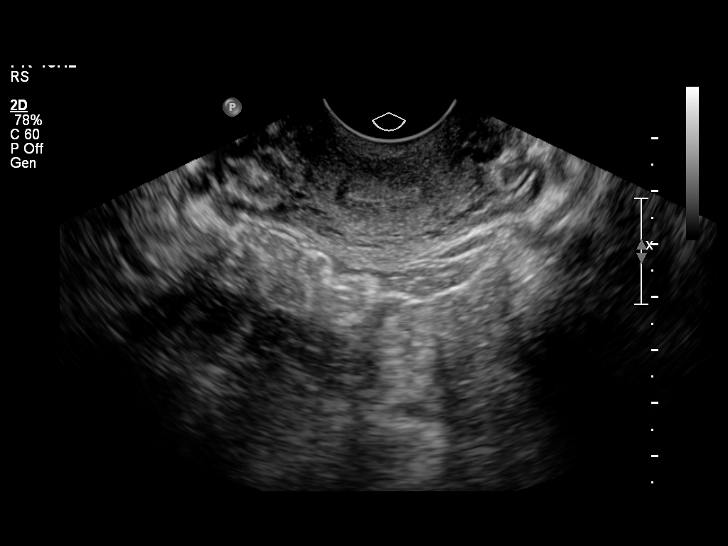
[im 27/44]
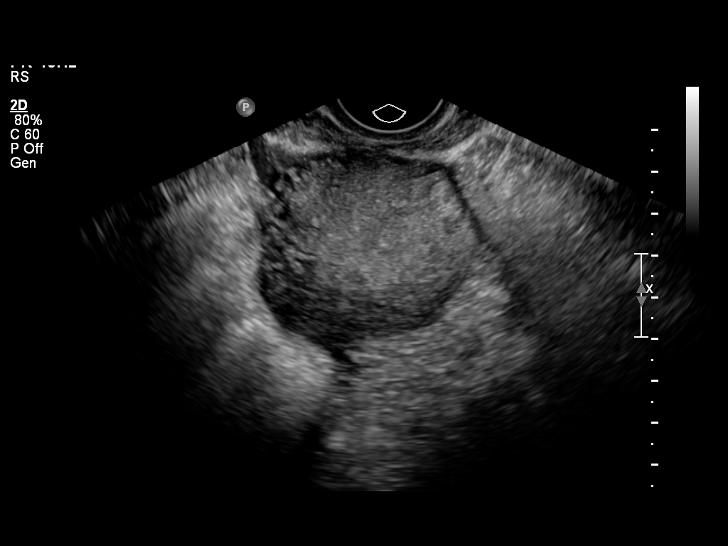
[im 29/44]
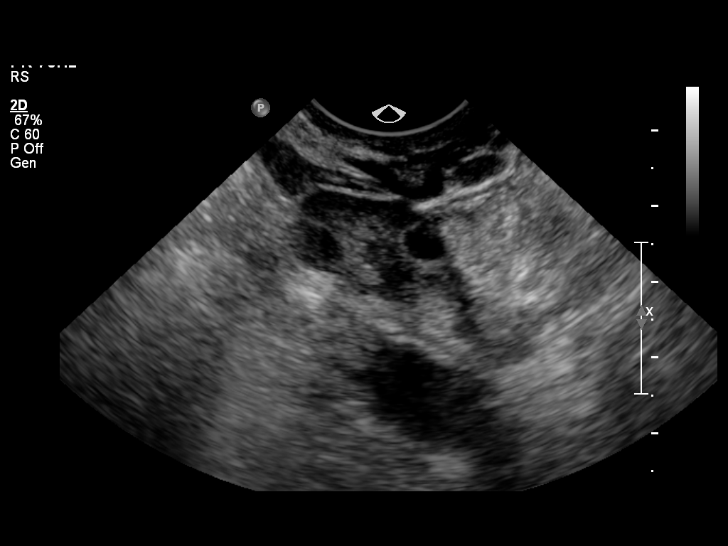
[im 33/44]
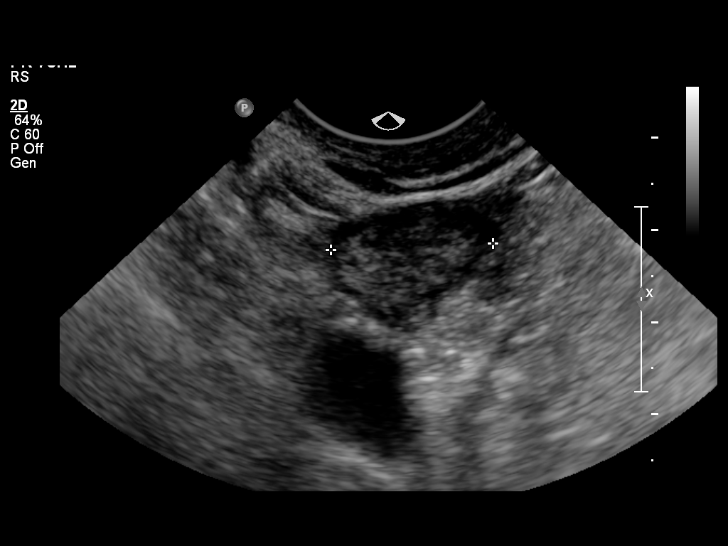
[im 36/44]
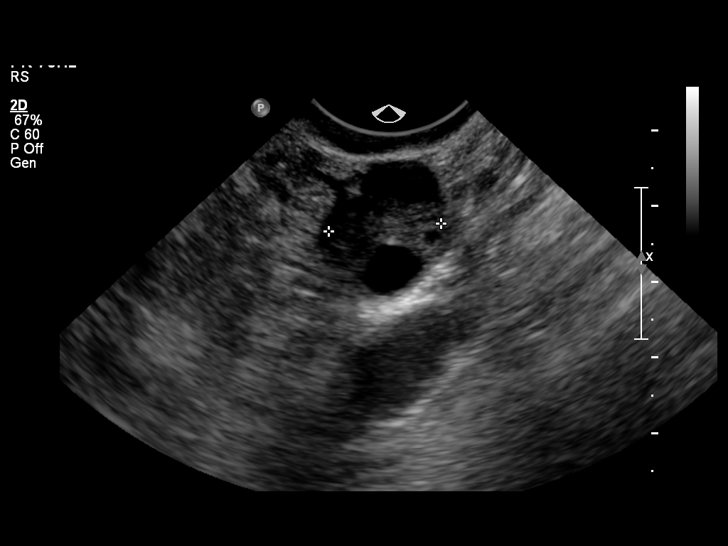
[im 40/44]
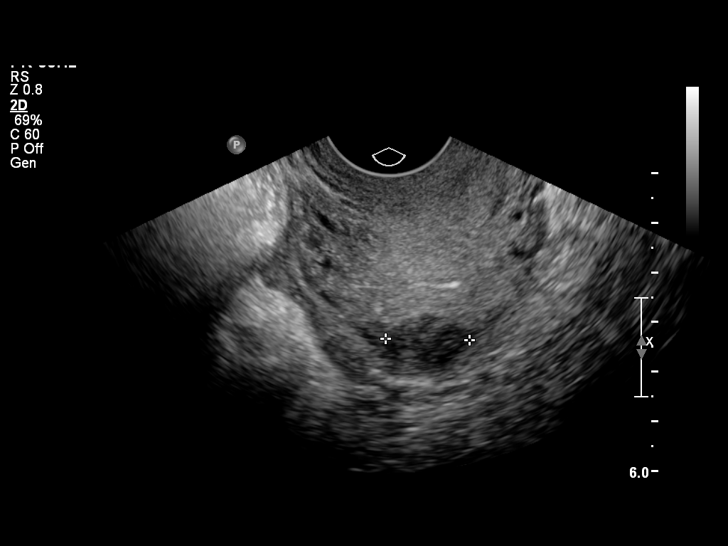
[im 44/44]
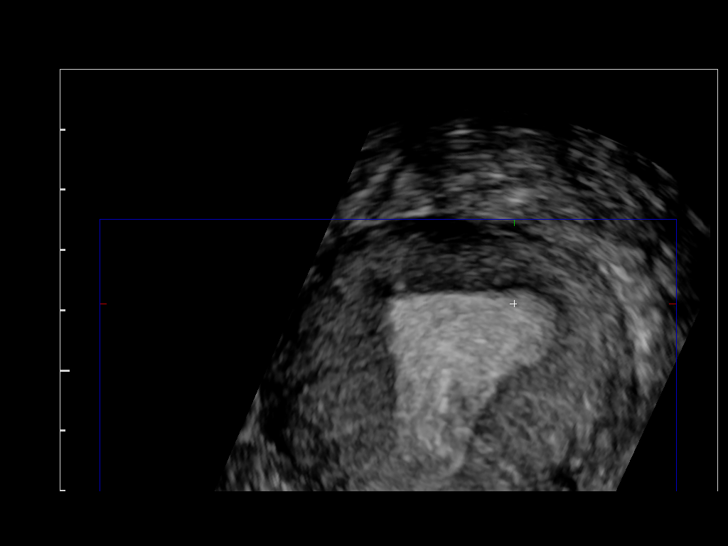

[14 of 25 positions shown; findings below may reference images not displayed]

It was necessary to proceed with endovaginal exam following the
transabdominal exam to visualize the endometrium.
FINDINGS: Uterus: The uterus measures 10.1 cm x 5.1 cm x 5.9 cm.  Small
anterior fundal fibroid is present.

Endometrium: 7 mm.  No retained products of conception.  No
endometrial vascular flow.

Right ovary:  21 mm x 12 mm x 18 mm.  Normal physiologic
appearance.

Left ovary: 31 mm x 819 mm x 15 mm.  Normal physiologic appearance.

Other findings: No free fluid
IMPRESSION: No retained products of conception.  Endometrial thickness 7 mm.
Small anterior fundal fibroid measuring 17 mm.

## 2013-12-17 ENCOUNTER — Other Ambulatory Visit: Payer: Self-pay | Admitting: Emergency Medicine

## 2014-01-05 ENCOUNTER — Other Ambulatory Visit: Payer: Self-pay | Admitting: Emergency Medicine

## 2014-10-30 ENCOUNTER — Other Ambulatory Visit: Payer: Self-pay | Admitting: Emergency Medicine

## 2014-11-27 ENCOUNTER — Other Ambulatory Visit: Payer: Self-pay | Admitting: Physician Assistant

## 2014-12-09 ENCOUNTER — Other Ambulatory Visit: Payer: Self-pay | Admitting: Obstetrics and Gynecology

## 2014-12-11 LAB — CYTOLOGY - PAP

## 2015-09-29 MED FILL — SYNTHROID 125 MCG TABLET: 125 | 90 days supply | Qty: 90 | Fill #0

## 2016-04-25 ENCOUNTER — Ambulatory Visit (INDEPENDENT_AMBULATORY_CARE_PROVIDER_SITE_OTHER): Payer: 59 | Admitting: Physician Assistant

## 2016-04-25 VITALS — BP 116/80 | HR 79 | Temp 98.5°F | Resp 17 | Ht 62.0 in | Wt 155.0 lb

## 2016-04-25 DIAGNOSIS — Z789 Other specified health status: Secondary | ICD-10-CM | POA: Diagnosis not present

## 2016-04-25 DIAGNOSIS — Z23 Encounter for immunization: Secondary | ICD-10-CM

## 2016-04-25 DIAGNOSIS — E039 Hypothyroidism, unspecified: Secondary | ICD-10-CM

## 2016-04-25 MED ORDER — LEVOTHYROXINE SODIUM 125 MCG PO TABS: 125.0000 ug | ORAL_TABLET | Freq: Every day | ORAL | 11 refills | Status: DC

## 2016-04-25 NOTE — Progress Notes (Signed)
04/25/2016 5:37 PM   DOB: 1971/07/12 / MRN: [id number]  SUBJECTIVE:  Abigail Goodwin is a 45 y.o. female presenting for a thyroid recheck.  She is taking 125 mcg daily and has been taking this dose for 3-4 years. She denies constipation and dry skin.  She has put on 10 lbs over the last few years and reports this has been gradual.   She needs MMR titers and a TDAP.  She has original documentation of the shots but these are in [place] and she can not get her hands on the documents. She thinks her last TD was in 2003.    She is allergic to ibuprofen.   She  has a past medical history of Hepatitis; Hypothyroidism; and Miscarriage.    She  reports that she has never smoked. She has never used smokeless tobacco. She reports that she does not drink alcohol or use drugs. She  reports that she currently engages in sexual activity. The patient  has a past surgical history that includes Cesarean section; Wisdom tooth extraction; and Dilation and evacuation (06/09/2011).  Her family history includes Diabetes in her father; Dysphagia in her mother; Hypertension in her father; Thyroid disease in her mother.  Review of Systems  Constitutional: Negative for chills and fever.  Eyes: Negative for blurred vision.  Respiratory: Negative for cough and shortness of breath.   Cardiovascular: Negative for chest pain.  Gastrointestinal: Negative for abdominal pain and nausea.  Genitourinary: Negative for dysuria, frequency and urgency.  Musculoskeletal: Negative for myalgias.  Skin: Negative for rash.  Neurological: Negative for dizziness, tingling and headaches.  Psychiatric/Behavioral: Negative for depression. The patient is not nervous/anxious.     The problem list and medications were reviewed and updated by myself where necessary and exist elsewhere in the encounter.   OBJECTIVE:  BP 116/80 (BP Location: Right Arm, Patient Position: Sitting, Cuff Size: Normal)   Pulse 79   Temp 98.5 F (36.9 C) (Oral)    Resp 17   Ht _0  (1.575 m)   Wt 155 lb (70.3 kg)   LMP 04/07/2016   SpO2 100%   BMI 28.35 kg/m   Physical Exam  Constitutional: She is oriented to person, place, and time. She appears well-nourished. No distress.  Eyes: EOM are normal. Pupils are equal, round, and reactive to light.  Cardiovascular: Normal rate.   Pulmonary/Chest: Effort normal.  Abdominal: She exhibits no distension.  Neurological: She is alert and oriented to person, place, and time. No cranial nerve deficit. Gait normal.  Skin: Skin is dry. She is not diaphoretic.  Psychiatric: She has a normal mood and affect.  Vitals reviewed.   No results found for this or any previous visit (from the past 72 hour(s)).  No results found.  ASSESSMENT AND PLAN  Alexandera was seen today for immunizations and other.  Diagnoses and all orders for this visit:  Need for Tdap vaccination: Of note she may need up to three administrations per her school documentation. I have advised that she can come here for readmin as her school requires.  TD will certainly suffice for future shots.   -     Tdap vaccine greater than or equal to 7yo IM  Measles, mumps, rubella (MMR) vaccination status unknown -     Measles/Mumps/Rubella Immunity  Hypothyroidism, unspecified hypothyroidism type -     TSH -     levothyroxine (SYNTHROID) 125 MCG tablet; Take 1 tablet (125 mcg total) by mouth daily before breakfast. NO MORE  REFILLS WITHOUT OFFICE VISIT - 2ND NOTICE    The patient is advised to call or return to clinic if she does not see an improvement in symptoms, or to seek the care of the closest emergency department if she worsens with the above plan.   Philis Fendt, MHS, PA-C [facility] 04/25/2016 5:37 PM

## 2016-04-25 NOTE — Patient Instructions (Signed)
     IF you received an x-ray today, you will receive an invoice from [facility]. Please contact  Radiology at [phone] with questions or concerns regarding your invoice.   IF you received labwork today, you will receive an invoice from [facility]. Please contact [facility] at [phone] with questions or concerns regarding your invoice.   Our billing staff will not be able to assist you with questions regarding bills from these companies.  You will be contacted with the lab results as soon as they are available. The fastest way to get your results is to activate your My Chart account. Instructions are located on the last page of this paperwork. If you have not heard from us regarding the results in 2 weeks, please contact this office.      

## 2016-04-26 LAB — TSH: TSH: 3.47 mIU/L

## 2016-04-27 ENCOUNTER — Other Ambulatory Visit: Payer: Self-pay | Admitting: Physician Assistant

## 2016-04-27 LAB — MEASLES/MUMPS/RUBELLA IMMUNITY
Mumps IgG: 217 AU/mL — ABNORMAL HIGH (ref <9.00)
Rubella: 6.48 Index — ABNORMAL HIGH (ref <0.90)

## 2016-04-28 ENCOUNTER — Other Ambulatory Visit: Payer: Self-pay | Admitting: Physician Assistant

## 2016-04-28 NOTE — Progress Notes (Signed)
MMR immunization only. Patient does not need to see provider. Please update [facility] and print after her admin for her records.  Philis Fendt, MS, PA-C 8:49 AM, 04/28/2016

## 2016-05-03 ENCOUNTER — Ambulatory Visit (INDEPENDENT_AMBULATORY_CARE_PROVIDER_SITE_OTHER): Payer: 59 | Admitting: Urgent Care

## 2016-05-03 VITALS — BP 122/84 | HR 64 | Temp 98.4°F | Resp 17 | Ht 62.0 in | Wt 154.0 lb

## 2016-05-03 DIAGNOSIS — E039 Hypothyroidism, unspecified: Secondary | ICD-10-CM

## 2016-05-03 DIAGNOSIS — Z23 Encounter for immunization: Secondary | ICD-10-CM

## 2016-05-03 MED ORDER — LEVOTHYROXINE SODIUM 137 MCG PO TABS: 137.0000 ug | ORAL_TABLET | Freq: Every day | ORAL | 1 refills | Status: DC

## 2016-05-03 NOTE — Progress Notes (Signed)
    MRN: [id number] DOB: [birth year]  Subjective:   Abigail Goodwin is a 45 y.o. female presenting for chief complaint of Immunizations (Needs rubella ) and Other (Concerning TSH levels )  Rubeola - Patient would like to have a rubeola vaccine. She was tested for immunity last week and since she is starting at [facility] needs to have this updated.   TSH - Managed with 120mg of levothyroxine. Patient reports that she feels cold intolerance, some weight gain, fatigue.   AMadalynhas a current medication list which includes the following prescription(s): levothyroxine. Also is allergic to ibuprofen.  AYeni has a past medical history of Hepatitis; Hypothyroidism; and Miscarriage. Also  has a past surgical history that includes Cesarean section; Wisdom tooth extraction; and Dilation and evacuation (06/09/2011).  Objective:   Vitals: BP 122/84 (BP Location: Right Arm, Patient Position: Sitting, Cuff Size: Normal)   Pulse 64   Temp 98.4 F (36.9 C) (Oral)   Resp 17   Ht _0  (1.575 m)   Wt 154 lb (69.9 kg)   LMP 04/07/2016   SpO2 99%   BMI 28.17 kg/m   Physical Exam  Constitutional: She is oriented to person, place, and time. She appears well-developed and well-nourished.  Neck: Normal range of motion. Neck supple. No thyromegaly present.  Cardiovascular: Normal rate, regular rhythm and intact distal pulses.  Exam reveals no gallop and no friction rub.   No murmur heard. Pulmonary/Chest: No respiratory distress. She has no wheezes. She has no rales.  Neurological: She is alert and oriented to person, place, and time.  Skin: Skin is warm.    Assessment and Plan :   1. Hypothyroidism, unspecified hypothyroidism type - Patient insisted on increasing her thyroid medication. I asked about anemia, she is being managed for this by her OB/GYN. I agreed to a very slight increase and recheck of her thyroid panel in 6-8 weeks. Counseled on potential for adverse effects, patient verbalized  understanding.  2. Need for MMR vaccine - MMR vaccine subcutaneous   MJaynee Eagles PA-C [facility] [id number]/16/2017 10:23 AM

## 2016-05-03 NOTE — Patient Instructions (Addendum)
Levothyroxine tablets What is this medicine? LEVOTHYROXINE (lee voe thye ROX een) is a thyroid hormone. This medicine can improve symptoms of thyroid deficiency such as slow speech, lack of energy, weight gain, hair loss, dry skin, and feeling cold. It also helps to treat goiter (an enlarged thyroid gland). It is also used to treat some kinds of thyroid cancer along with surgery and other medicines. This medicine may be used for other purposes; ask your health care provider or pharmacist if you have questions. What should I tell my health care provider before I take this medicine? They need to know if you have any of these conditions: -angina -blood clotting problems -diabetes -dieting or on a weight loss program -fertility problems -heart disease -high levels of thyroid hormone -pituitary gland problem -previous heart attack -an unusual or allergic reaction to levothyroxine, thyroid hormones, other medicines, foods, dyes, or preservatives -pregnant or trying to get pregnant -breast-feeding How should I use this medicine? Take this medicine by mouth with plenty of water. It is best to take on an empty stomach, at least 30 minutes before or 2 hours after food. Follow the directions on the prescription label. Take at the same time each day. Do not take your medicine more often than directed. Contact your pediatrician regarding the use of this medicine in children. While this drug may be prescribed for children and infants as young as a few days of age for selected conditions, precautions do apply. For infants, you may crush the tablet and place in a small amount of (5-10 ml or 1 to 2 teaspoonfuls) of water, breast milk, or non-soy based infant formula. Do not mix with soy-based infant formula. Give as directed. Overdosage: If you think you have taken too much of this medicine contact a poison control center or emergency room at once. NOTE: This medicine is only for you. Do not share this medicine  with others. What if I miss a dose? If you miss a dose, take it as soon as you can. If it is almost time for your next dose, take only that dose. Do not take double or extra doses. What may interact with this medicine? -amiodarone -antacids -anti-thyroid medicines -calcium supplements -carbamazepine -cholestyramine -colestipol -digoxin -female hormones, including contraceptive or birth control pills -iron supplements -ketamine -liquid nutrition products like Ensure -medicines for colds and breathing difficulties -medicines for diabetes -medicines for mental depression -medicines or herbals used to decrease weight or appetite -phenobarbital or other barbiturate medications -phenytoin -prednisone or other corticosteroids -rifabutin -rifampin -soy isoflavones -sucralfate -theophylline -warfarin This list may not describe all possible interactions. Give your health care provider a list of all the medicines, herbs, non-prescription drugs, or dietary supplements you use. Also tell them if you smoke, drink alcohol, or use illegal drugs. Some items may interact with your medicine. What should I watch for while using this medicine? Be sure to take this medicine with plenty of fluids. Some tablets may cause choking, gagging, or difficulty swallowing from the tablet getting stuck in your throat. Most of these problems disappear if the medicine is taken with the right amount of water or other fluids. Do not switch brands of this medicine unless your health care professional agrees with the change. Ask questions if you are uncertain. You will need regular exams and occasional blood tests to check the response to treatment. If you are receiving this medicine for an underactive thyroid, it may be several weeks before you notice an improvement. Check with your doctor or  health care professional if your symptoms do not improve. It may be necessary for you to take this medicine for the rest of your  life. Do not stop using this medicine unless your doctor or health care professional advises you to. This medicine can affect blood sugar levels. If you have diabetes, check your blood sugar as directed. You may lose some of your hair when you first start treatment. With time, this usually corrects itself. If you are going to have surgery, tell your doctor or health care professional that you are taking this medicine. What side effects may I notice from receiving this medicine? Side effects that you should report to your doctor or health care professional as soon as possible: -allergic reactions like skin rash, itching or hives, swelling of the face, lips, or tongue -chest pain -excessive sweating or intolerance to heat -fast or irregular heartbeat -nervousness -skin rash or hives -swelling of ankles, feet, or legs -tremors Side effects that usually do not require medical attention (report to your doctor or health care professional if they continue or are bothersome): -changes in appetite -changes in menstrual periods -diarrhea -hair loss -headache -trouble sleeping -weight loss This list may not describe all possible side effects. Call your doctor for medical advice about side effects. You may report side effects to FDA at 1-800-FDA-1088. Where should I keep my medicine? Keep out of the reach of children. Store at room temperature between 15 and 30 degrees C (59 and 86 degrees F). Protect from light and moisture. Keep container tightly closed. Throw away any unused medicine after the expiration date. NOTE: This sheet is a summary. It may not cover all possible information. If you have questions about this medicine, talk to your doctor, pharmacist, or health care provider.    2016, Elsevier/Gold Standard. (2008-12-11 14:28:07)    MMR Vaccine (Measles, Mumps and Rubella): What You Need to Know 1. Why get vaccinated? Measles, mumps, and rubella are serious diseases. Before vaccines  they were very common, especially among children. Measles  Measles virus causes rash, cough, runny nose, eye irritation, and fever.  It can lead to ear infection, pneumonia, seizures (jerking and staring), brain damage, and death. Mumps  Mumps virus causes fever, headache, muscle pain, loss of appetite, and swollen glands.  It can lead to deafness, meningitis (infection of the brain and spinal cord covering), painful swelling of the testicles or ovaries, and rarely sterility. Rubella ([place] Measles)  Rubella virus causes rash, arthritis (mostly in women), and mild fever.  If a woman gets rubella while she is pregnant, she could have a miscarriage or her baby could be born with serious birth defects. These diseases spread from person to person through the air. You can easily catch them by being around someone who is already infected. Measles, mumps, and rubella (MMR) vaccine can protect children (and adults) from all three of these diseases. Thanks to successful vaccination programs these diseases are much less common in the [place] than they used to be. But if we stopped vaccinating they would return.  2. Who should get MMR vaccine and when? Children should get 2 doses of MMR vaccine:  First Dose: 69-60 months of age  Second Dose: 75-13 years of age (may be given earlier, if at least 28 days after the 1st dose) Some infants younger than 12 months should get a dose of MMR if they are traveling out of the country. (This dose will not count toward their routine series.) Some adults should also get  MMR vaccine: Generally, anyone 41 years of age or older who was born after [birth year] should get at least one dose of MMR vaccine, unless they can show that they have either been vaccinated or had all three diseases. MMR vaccine may be given at the same time as other vaccines. Children between 43 and 14 years of age can get a "combination" vaccine called MMRV, which contains both MMR and varicella  (chickenpox) vaccines. There is a separate Vaccine Information Statement for MMRV. 3. Some people should not get MMR vaccine or should wait.  Anyone who has ever had a life-threatening allergic reaction to the antibiotic neomycin, or any other component of MMR vaccine, should not get the vaccine. Tell your doctor if you have any severe allergies.  Anyone who had a life-threatening allergic reaction to a previous dose of MMR or MMRV vaccine should not get another dose.  Some people who are sick at the time the shot is scheduled may be advised to wait until they recover before getting MMR vaccine.  Pregnant women should not get MMR vaccine. Pregnant women who need the vaccine should wait until after giving birth. Women should avoid getting pregnant for 4 weeks after vaccination with MMR vaccine.  Tell your doctor if the person getting the vaccine:  Has HIV/AIDS, or another disease that affects the immune system  Is being treated with drugs that affect the immune system, such as steroids  Has any kind of cancer  Is being treated for cancer with radiation or drugs  Has ever had a low platelet count (a blood disorder)  Has gotten another vaccine within the past 4 weeks  Has recently had a transfusion or received other blood products Any of these might be a reason to not get the vaccine, or delay vaccination until later. 4. What are the risks from MMR vaccine? A vaccine, like any medicine, is capable of causing serious problems, such as severe allergic reactions.  The risk of MMR vaccine causing serious harm, or death, is extremely small. Getting MMR vaccine is much safer than getting measles, mumps or rubella. Most people who get MMR vaccine do not have any serious problems with it. Mild problems  Fever (up to 1 person out of 6)  Mild rash (about 1 person out of 20)  Swelling of glands in the cheeks or neck (about 1 person out of 75) If these problems occur, it is usually within  6-14 days after the shot. They occur less often after the second dose. Moderate problems  Seizure (jerking or staring) caused by fever (about 1 out of 3,000 doses)  Temporary pain and stiffness in the joints, mostly in teenage or adult women (up to 1 out of 4)  Temporary low platelet count, which can cause a bleeding disorder (about 1 out of 30,000 doses) Severe problems (very rare)  Serious allergic reaction (less than 1 out of a million doses)  Several other severe problems have been reported after a child gets MMR vaccine, including:  Deafness  Long-term seizures, coma, or lowered consciousness  Permanent brain damage These are so rare that it is hard to tell whether they are caused by the vaccine.  5. What if there is a serious reaction? What should I look for?  Look for anything that concerns you, such as signs of a severe allergic reaction, very high fever, or behavior changes. Signs of a severe allergic reaction can include hives, swelling of the face and throat, difficulty breathing, a fast heartbeat,  dizziness, and weakness. These would start a few minutes to a few hours after the vaccination.  What should I do?  If you think it is a severe allergic reaction or other emergency that can't wait, call 9-1-1 or get the person to the nearest hospital. Otherwise, call your doctor.  Afterward, the reaction should be reported to the Vaccine Adverse Event Reporting System (VAERS). Your doctor might file this report, or you can do it yourself through the VAERS web site at www.vaers.SamedayNews.es, or by calling 580-305-2871. VAERS is only for reporting reactions. They do not give medical advice. 6. The National Vaccine Injury Compensation Program The [employer] Vaccine Injury Compensation Program (VICP) is a federal program that was created to compensate people who may have been injured by certain vaccines. Persons who believe they may have been injured by a vaccine can learn about the  program and about filing a claim by calling (208)287-0360 or visiting the [employer] website at GoldCloset.com.ee. 7. How can I learn more?  Ask your doctor.  Call your local or state health department.  Contact the Centers for Disease Control and Prevention (CDC):  Call 613-645-3486 (1-800-CDC-INFO)  or  Visit CDC's website at http://hunter.com/ CDC Measles, Mumps, and Rubella (MMR) Interim VIS (01/06/11)   This information is not intended to replace advice given to you by your health care provider. Make sure you discuss any questions you have with your health care provider.   Document Released: 07/02/2006 Document Revised: 09/25/2014 Document Reviewed: 10/16/2013 Elsevier Interactive Patient Education 2016 [employer].     IF you received an x-ray today, you will receive an invoice from [facility]. Please contact [facility] at [phone] with questions or concerns regarding your invoice.   IF you received labwork today, you will receive an invoice from [employer]. Please contact [facility] at [phone] with questions or concerns regarding your invoice.   Our billing staff will not be able to assist you with questions regarding bills from these companies.  You will be contacted with the lab results as soon as they are available. The fastest way to get your results is to activate your My Chart account. Instructions are located on the last page of this paperwork. If you have not heard from [place] regarding the results in 2 weeks, please contact this office.

## 2016-09-16 ENCOUNTER — Other Ambulatory Visit: Payer: Self-pay | Admitting: Urgent Care

## 2016-09-28 DIAGNOSIS — N979 Female infertility, unspecified: Secondary | ICD-10-CM | POA: Diagnosis not present

## 2016-09-28 DIAGNOSIS — D259 Leiomyoma of uterus, unspecified: Secondary | ICD-10-CM | POA: Diagnosis not present

## 2016-09-28 DIAGNOSIS — Z6828 Body mass index (BMI) 28.0-28.9, adult: Secondary | ICD-10-CM | POA: Diagnosis not present

## 2016-09-28 DIAGNOSIS — Z01419 Encounter for gynecological examination (general) (routine) without abnormal findings: Secondary | ICD-10-CM | POA: Diagnosis not present

## 2016-09-28 DIAGNOSIS — Z1231 Encounter for screening mammogram for malignant neoplasm of breast: Secondary | ICD-10-CM | POA: Diagnosis not present

## 2016-10-17 DIAGNOSIS — H5213 Myopia, bilateral: Secondary | ICD-10-CM | POA: Diagnosis not present

## 2016-11-02 MED FILL — SYNTHROID 137 MCG TABLET: 137 | 30 days supply | Qty: 30 | Fill #0

## 2016-11-08 DIAGNOSIS — N852 Hypertrophy of uterus: Secondary | ICD-10-CM | POA: Diagnosis not present

## 2016-11-08 DIAGNOSIS — N979 Female infertility, unspecified: Secondary | ICD-10-CM | POA: Diagnosis not present

## 2016-11-08 DIAGNOSIS — D259 Leiomyoma of uterus, unspecified: Secondary | ICD-10-CM | POA: Diagnosis not present

## 2016-11-20 MED FILL — HYDROCODON-APAP 7.5-325: 7.5-325 | 2 days supply | Qty: 8 | Fill #0

## 2016-12-08 ENCOUNTER — Other Ambulatory Visit: Payer: Self-pay | Admitting: Urgent Care

## 2016-12-08 MED FILL — SYNTHROID 137 MCG TABLET: 137 | 30 days supply | Qty: 30 | Fill #0

## 2017-01-15 MED FILL — SYNTHROID 137 MCG TABLET: 137 | 30 days supply | Qty: 30 | Fill #1

## 2017-02-19 MED FILL — SYNTHROID 137 MCG TABLET: 137 | 30 days supply | Qty: 30 | Fill #2

## 2017-04-02 ENCOUNTER — Other Ambulatory Visit: Payer: Self-pay | Admitting: Urgent Care

## 2017-04-02 MED FILL — SYNTHROID 137 MCG TABLET: 137 | 30 days supply | Qty: 30 | Fill #0

## 2017-05-10 ENCOUNTER — Encounter: Payer: Self-pay | Admitting: Family Medicine

## 2017-05-10 ENCOUNTER — Ambulatory Visit (INDEPENDENT_AMBULATORY_CARE_PROVIDER_SITE_OTHER): Payer: 59 | Admitting: Family Medicine

## 2017-05-10 VITALS — BP 124/72 | HR 76 | Temp 98.4°F | Ht 62.0 in | Wt 153.2 lb

## 2017-05-10 DIAGNOSIS — E039 Hypothyroidism, unspecified: Secondary | ICD-10-CM | POA: Diagnosis not present

## 2017-05-10 DIAGNOSIS — Z23 Encounter for immunization: Secondary | ICD-10-CM | POA: Diagnosis not present

## 2017-05-10 DIAGNOSIS — E559 Vitamin D deficiency, unspecified: Secondary | ICD-10-CM

## 2017-05-10 DIAGNOSIS — Z0000 Encounter for general adult medical examination without abnormal findings: Secondary | ICD-10-CM | POA: Diagnosis not present

## 2017-05-10 DIAGNOSIS — E785 Hyperlipidemia, unspecified: Secondary | ICD-10-CM | POA: Diagnosis not present

## 2017-05-10 LAB — LDL CHOLESTEROL, DIRECT: Direct LDL: 132 mg/dL

## 2017-05-10 LAB — COMPREHENSIVE METABOLIC PANEL
ALT: 15 U/L (ref 0–35)
AST: 16 U/L (ref 0–37)
Albumin: 4.3 g/dL (ref 3.5–5.2)
Alkaline Phosphatase: 73 U/L (ref 39–117)
BUN: 9 mg/dL (ref 6–23)
CO2: 29 mEq/L (ref 19–32)
Calcium: 9.8 mg/dL (ref 8.4–10.5)
Chloride: 102 mEq/L (ref 96–112)
Creatinine, Ser: 0.73 mg/dL (ref 0.40–1.20)
GFR: 91.88 mL/min (ref >60.00)
Glucose, Bld: 92 mg/dL (ref 70–99)
Potassium: 4.6 mEq/L (ref 3.5–5.1)
Sodium: 136 mEq/L (ref 135–145)
Total Bilirubin: 0.3 mg/dL (ref 0.2–1.2)
Total Protein: 8 g/dL (ref 6.0–8.3)

## 2017-05-10 LAB — LIPID PANEL
Cholesterol: 201 mg/dL — ABNORMAL HIGH (ref 0–200)
HDL: 44.3 mg/dL (ref >39.00)
NonHDL: 157.05
Total CHOL/HDL Ratio: 5
Triglycerides: 215 mg/dL — ABNORMAL HIGH (ref 0.0–149.0)
VLDL: 43 mg/dL — ABNORMAL HIGH (ref 0.0–40.0)

## 2017-05-10 LAB — CBC WITH DIFFERENTIAL/PLATELET
Basophils Absolute: 0 10*3/uL (ref 0.0–0.1)
Basophils Relative: 0.5 % (ref 0.0–3.0)
Eosinophils Absolute: 0.2 10*3/uL (ref 0.0–0.7)
Eosinophils Relative: 2 % (ref 0.0–5.0)
HCT: 41.5 % (ref 36.0–46.0)
Hemoglobin: 13.4 g/dL (ref 12.0–15.0)
Lymphocytes Relative: 29.7 % (ref 12.0–46.0)
Lymphs Abs: 2.8 10*3/uL (ref 0.7–4.0)
MCHC: 32.3 g/dL (ref 30.0–36.0)
MCV: 82.7 fl (ref 78.0–100.0)
Monocytes Absolute: 0.5 10*3/uL (ref 0.1–1.0)
Monocytes Relative: 5.4 % (ref 3.0–12.0)
Neutro Abs: 5.8 10*3/uL (ref 1.4–7.7)
Neutrophils Relative %: 62.4 % (ref 43.0–77.0)
Platelets: 433 10*3/uL — ABNORMAL HIGH (ref 150.0–400.0)
RBC: 5.01 Mil/uL (ref 3.87–5.11)
RDW: 13.7 % (ref 11.5–15.5)
WBC: 9.4 10*3/uL (ref 4.0–10.5)

## 2017-05-10 LAB — VITAMIN D 25 HYDROXY (VIT D DEFICIENCY, FRACTURES): VITD: 28.66 ng/mL — ABNORMAL LOW (ref 30.00–100.00)

## 2017-05-10 LAB — TSH: TSH: 0.53 u[IU]/mL (ref 0.35–4.50)

## 2017-05-10 LAB — T4, FREE: Free T4: 1.23 ng/dL (ref 0.60–1.60)

## 2017-05-10 NOTE — Progress Notes (Signed)
Abigail Goodwin is a 46 y.o. female is here to [place].   Patient Care Team: Briscoe Deutscher, DO as PCP - General (Family Medicine)   History of Present Illness:   Gertie Exon, CMA, acting as scribe for Dr. Juleen China.  HPI:  Patient comes in today to establish care.  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  States that she is still considering having another child.  Goes to [facility] for her gynecologic care and physical exams.  States she has had her cholesterol checked there as well and it was slightly high and she was told to take Omega 3.  She takes Synthroid and has for over 14 years.  She states she has been feeling fatigued recently.  No skin changes, weight changes, or hair loss.  It has been over a year since she had her thyroid labs checked and would like to have that done today.    She would like to get the flu shot today.  Health Maintenance Due  Topic Date Due  . INFLUENZA VACCINE  04/18/2017   Depression screen PHQ 2/9 05/10/2017  Decreased Interest 0  Down, Depressed, Hopeless 0  PHQ - 2 Score 0   PMHx, SurgHx, SocialHx, Medications, and Allergies were reviewed in the Visit Navigator and updated as appropriate.   Past Medical History:  Diagnosis Date  . Hepatitis at age 87   . Hypothyroidism   . Miscarriage    Past Surgical History:  Procedure Laterality Date  . CESAREAN SECTION    . DILATION AND EVACUATION  06/09/2011   Procedure: DILATATION AND EVACUATION (D&E) 2ND TRIMESTER;  Surgeon: Cyril Mourning, MD;  Location: [facility];  Service: Gynecology;  Laterality: N/A;  14 weeks/ultrasound and chromosome studies  . WISDOM TOOTH EXTRACTION     Family History  Problem Relation Age of Onset  . Thyroid disease Mother   . Dysphagia Mother   . Hypertension Father   . Diabetes Father    Social History  Substance Use Topics  . Smoking status: Never Smoker  . Smokeless tobacco: Never Used  . Alcohol use No   Current  Medications and Allergies:   Current Outpatient Prescriptions:  .  SYNTHROID 137 MCG tablet, TAKE 1 TABLET BY MOUTH ONCE DAILY BEFORE BREAKFAST, Disp: 30 tablet, Rfl: 2  Allergies  Allergen Reactions  . Ibuprofen Swelling    Swelling/rash on face   Review of Systems:   Pertinent items are noted in the HPI. Otherwise, ROS is negative.  Vitals:   Vitals:   05/10/17 0924  BP: 124/72  Pulse: 76  Temp: 98.4 F (36.9 C)  TempSrc: Oral  SpO2: 99%  Weight: 153 lb 3.2 oz (69.5 kg)  Height: 5\' 2"  (1.575 m)     Body mass index is 28.02 kg/m.   Physical Exam:   Physical Exam  Constitutional: She is oriented to person, place, and time. She appears well-developed and well-nourished. No distress.  HENT:  Head: Normocephalic and atraumatic.  Right Ear: External ear normal.  Left Ear: External ear normal.  Nose: Nose normal.  Mouth/Throat: Oropharynx is clear and moist.  Eyes: Pupils are equal, round, and reactive to light. Conjunctivae and EOM are normal.  Neck: Normal range of motion. Neck supple.  Mild goiter.  Cardiovascular: Normal rate, regular rhythm, normal heart sounds and intact distal pulses.   Pulmonary/Chest: Effort normal and breath sounds normal.  Abdominal: Soft. Bowel sounds are normal.  Musculoskeletal: Normal range  of motion.  Neurological: She is alert and oriented to person, place, and time.  Skin: Skin is warm. Capillary refill takes less than 2 seconds.  Psychiatric: She has a normal mood and affect. Her behavior is normal.  Nursing note and vitals reviewed.  Assessment and Plan:   Mulki was seen today for establish care.  Diagnoses and all orders for this visit:  Routine physical examination -     CBC with Differential/Platelet -     Comprehensive metabolic panel -     Lipid panel  Hypothyroidism, unspecified type Comments: At goal. Orders: -     TSH -     T4, free -     CBC with Differential/Platelet -     Comprehensive metabolic panel -      Lipid panel  Vitamin D deficiency -     VITAMIN D 25 Hydroxy (Vit-D Deficiency, Fractures)  Need for immunization against influenza -     Flu Vaccine QUAD 36+ mos IM  Other orders -     LDL cholesterol, direct  Patient Counseling:   [x]     Nutrition: Stressed importance of moderation in sodium/caffeine intake, saturated fat and cholesterol, caloric balance, sufficient intake of fresh fruits, vegetables, fiber, calcium, iron, and 1 mg of folate supplement per day (for females capable of pregnancy).   [x]      Stressed the importance of regular exercise.    []     Substance Abuse: Discussed cessation/primary prevention of tobacco, alcohol, or other drug use; driving or other dangerous activities under the influence; availability of treatment for abuse.    [x]      Injury prevention: Discussed safety belts, safety helmets, smoke detector, smoking near bedding or upholstery.    []      Sexuality: Discussed sexually transmitted diseases, partner selection, use of condoms, avoidance of unintended pregnancy  and contraceptive alternatives.    [x]     Dental health: Discussed importance of regular tooth brushing, flossing, and dental visits.   [x]      Health maintenance and immunizations reviewed. Please refer to Health maintenance section.    . Reviewed expectations re: course of current medical issues. . Discussed self-management of symptoms. . Outlined signs and symptoms indicating need for more acute intervention. . Patient verbalized understanding and all questions were answered. Marland Kitchen Health Maintenance issues including appropriate healthy diet, exercise, and smoking avoidance were discussed with patient. . See orders for this visit as documented in the electronic medical record. . Patient received an After Visit Summary.  CMA served as Education administrator during this visit. History, Physical, and Plan performed by medical provider. The above documentation has been reviewed and is accurate  and complete. Briscoe Deutscher, D.O.  Briscoe Deutscher, DO [place] [place] 05/10/2017  Future Appointments Date Time Provider [place]  05/09/2018 10:00 AM Briscoe Deutscher, DO [facility] None

## 2017-05-14 ENCOUNTER — Other Ambulatory Visit: Payer: Self-pay

## 2017-05-14 MED ORDER — SYNTHROID 137 MCG PO TABS: 137.0000 ug | ORAL_TABLET | Freq: Every day | ORAL | 1 refills | Status: DC

## 2017-05-14 MED FILL — SYNTHROID 137 MCG TABLET: 137 | 90 days supply | Qty: 90 | Fill #0

## 2017-08-22 MED FILL — SYNTHROID 137 MCG TABLET: 137 | 90 days supply | Qty: 90 | Fill #1

## 2017-09-14 DIAGNOSIS — E039 Hypothyroidism, unspecified: Secondary | ICD-10-CM | POA: Diagnosis not present

## 2017-09-14 DIAGNOSIS — T7840XA Allergy, unspecified, initial encounter: Secondary | ICD-10-CM | POA: Diagnosis not present

## 2017-09-15 DIAGNOSIS — T7840XA Allergy, unspecified, initial encounter: Secondary | ICD-10-CM | POA: Diagnosis not present

## 2017-09-15 DIAGNOSIS — E039 Hypothyroidism, unspecified: Secondary | ICD-10-CM | POA: Diagnosis not present

## 2017-10-22 DIAGNOSIS — H5213 Myopia, bilateral: Secondary | ICD-10-CM | POA: Diagnosis not present

## 2017-10-31 DIAGNOSIS — Z3141 Encounter for fertility testing: Secondary | ICD-10-CM | POA: Diagnosis not present

## 2017-10-31 DIAGNOSIS — Z3169 Encounter for other general counseling and advice on procreation: Secondary | ICD-10-CM | POA: Diagnosis not present

## 2017-12-19 ENCOUNTER — Other Ambulatory Visit: Payer: Self-pay | Admitting: Family Medicine

## 2017-12-19 MED FILL — SYNTHROID 137 MCG TABLET: 137 | 30 days supply | Qty: 30 | Fill #1

## 2018-01-22 DIAGNOSIS — Z01419 Encounter for gynecological examination (general) (routine) without abnormal findings: Secondary | ICD-10-CM | POA: Diagnosis not present

## 2018-01-22 DIAGNOSIS — Z6827 Body mass index (BMI) 27.0-27.9, adult: Secondary | ICD-10-CM | POA: Diagnosis not present

## 2018-01-22 DIAGNOSIS — Z1231 Encounter for screening mammogram for malignant neoplasm of breast: Secondary | ICD-10-CM | POA: Diagnosis not present

## 2018-01-22 DIAGNOSIS — Z1212 Encounter for screening for malignant neoplasm of rectum: Secondary | ICD-10-CM | POA: Diagnosis not present

## 2018-01-23 DIAGNOSIS — E559 Vitamin D deficiency, unspecified: Secondary | ICD-10-CM | POA: Diagnosis not present

## 2018-01-23 DIAGNOSIS — Z131 Encounter for screening for diabetes mellitus: Secondary | ICD-10-CM | POA: Diagnosis not present

## 2018-01-23 DIAGNOSIS — Z809 Family history of malignant neoplasm, unspecified: Secondary | ICD-10-CM | POA: Diagnosis not present

## 2018-01-23 DIAGNOSIS — E785 Hyperlipidemia, unspecified: Secondary | ICD-10-CM | POA: Diagnosis not present

## 2018-01-23 DIAGNOSIS — Z13228 Encounter for screening for other metabolic disorders: Secondary | ICD-10-CM | POA: Diagnosis not present

## 2018-01-23 DIAGNOSIS — Z130 Encounter for screening for diseases of the blood and blood-forming organs and certain disorders involving the immune mechanism: Secondary | ICD-10-CM | POA: Diagnosis not present

## 2018-01-30 MED FILL — SYNTHROID 150 MCG TABLET: 150 | 90 days supply | Qty: 90 | Fill #0

## 2018-03-12 DIAGNOSIS — N912 Amenorrhea, unspecified: Secondary | ICD-10-CM | POA: Diagnosis not present

## 2018-05-09 ENCOUNTER — Encounter: Payer: 59 | Admitting: Family Medicine

## 2018-05-16 MED FILL — SYNTHROID 150 MCG TABLET: 150 | 90 days supply | Qty: 90 | Fill #1

## 2018-09-05 MED FILL — SYNTHROID 150 MCG TABLET: 150 | 90 days supply | Qty: 90 | Fill #2

## 2018-10-23 DIAGNOSIS — H5213 Myopia, bilateral: Secondary | ICD-10-CM | POA: Diagnosis not present

## 2018-11-29 MED FILL — SYNTHROID 150 MCG TABLET: 150 | 90 days supply | Qty: 90 | Fill #3

## 2019-04-02 MED FILL — SYNTHROID 150 MCG TABLET: 150 | 90 days supply | Qty: 90 | Fill #0

## 2019-07-30 ENCOUNTER — Telehealth: Payer: Self-pay | Admitting: *Deleted

## 2019-07-30 NOTE — Telephone Encounter (Signed)
Copied from [place] [phone]. Topic: Quick Communication - Rx Refill/Question >> Jul 30, 2019  1:45 PM Rainey Pines A wrote: Medication: SYNTHROID 137 MCG tablet (Patient is requesting a supply of medication until her appt 12/21)  Has the patient contacted their pharmacy? {Yes (Agent: If no, request that the patient contact the pharmacy for the refill.) (Agent: If yes, when and what did the pharmacy advise?)Contact PCP  Preferred Pharmacy (with phone number or street name): [place], [place] - [place] [phone] (Phone) [phone] (Fax)    Agent: Please be advised that RX refills may take up to 3 business days. We ask that you follow-up with your pharmacy.

## 2019-08-05 ENCOUNTER — Other Ambulatory Visit: Payer: Self-pay

## 2019-08-05 MED ORDER — SYNTHROID 137 MCG PO TABS: ORAL_TABLET | ORAL | 0 refills | Status: DC

## 2019-08-05 MED FILL — SYNTHROID 137 MCG TABLET: 137 | 30 days supply | Qty: 30 | Fill #0

## 2019-08-05 NOTE — Telephone Encounter (Signed)
Please advise if appointment is needed or if medication can be refilled one time since [facility] is already scheduled

## 2019-08-05 NOTE — Telephone Encounter (Signed)
Pt has made 12/21 earliest appt wih DR Rogers Blocker . She needs this  med refill has been out for a week. She will be glad to do a virtual appt if this is required but she can till that she has nopt been taking her med, if not going to refill then pls fu or refill if possible.

## 2019-08-05 NOTE — Telephone Encounter (Signed)
Spoke to patient and advised per Dr. Rogers Blocker we will send in Synthroid 137 mcg #30 w/0 refills.  Advised patient that Dr. Rogers Blocker needs to see her in office for med refill/exam/labs due to pt not being seen since 04/2017.  Patient verbalized understanding.  Appointment made for 12/11@ 2:40 pm.

## 2019-08-29 ENCOUNTER — Encounter: Payer: Self-pay | Admitting: Family Medicine

## 2019-08-29 ENCOUNTER — Other Ambulatory Visit: Payer: Self-pay

## 2019-08-29 ENCOUNTER — Ambulatory Visit (INDEPENDENT_AMBULATORY_CARE_PROVIDER_SITE_OTHER): Payer: 59 | Admitting: Family Medicine

## 2019-08-29 ENCOUNTER — Ambulatory Visit: Payer: 59 | Admitting: Family Medicine

## 2019-08-29 VITALS — BP 114/68 | HR 84 | Temp 98.2°F | Ht 62.0 in | Wt 154.0 lb

## 2019-08-29 DIAGNOSIS — E569 Vitamin deficiency, unspecified: Secondary | ICD-10-CM

## 2019-08-29 DIAGNOSIS — L663 Perifolliculitis capitis abscedens: Secondary | ICD-10-CM | POA: Diagnosis not present

## 2019-08-29 DIAGNOSIS — Z13220 Encounter for screening for lipoid disorders: Secondary | ICD-10-CM

## 2019-08-29 DIAGNOSIS — E559 Vitamin D deficiency, unspecified: Secondary | ICD-10-CM

## 2019-08-29 DIAGNOSIS — E039 Hypothyroidism, unspecified: Secondary | ICD-10-CM | POA: Diagnosis not present

## 2019-08-29 MED ORDER — CLINDAMYCIN PHOSPHATE 1 % EX SOLN: Freq: Two times a day (BID) | CUTANEOUS | 3 refills | Status: DC

## 2019-08-29 MED FILL — CLINDAMYCIN PH 1% SOLUTION: 1 | 30 days supply | Qty: 60 | Fill #0

## 2019-08-29 NOTE — Patient Instructions (Addendum)
-  can try tea tree oil shampoo or ketoconazole shampoo (nizoral shampoo).   - we can do topical abx (clindamycine) or do oral antibiotics x 3 months. If does'nt get better: derm.

## 2019-08-29 NOTE — Progress Notes (Signed)
Patient: Abigail Goodwin MRN: [id number] DOB: 05/18/1971 PCP: Briscoe Deutscher, DO     Subjective:  Chief Complaint  Patient presents with  . Medication Refill  . scabs on scalp    HPI: The patient is a 48 y.o. female who presents today for transfer of care from dr. Juleen China. She has hypothyroidism and vitamin D deficiency.   Hypothyroidism: had labs done at her GYN and was normal last check.   Vitamin D: has not had this checked in awhile. She is a vegetarian.   Scabs on head: she noticed this a few months ago. She has tried t-cell shampoo and it didn't do anything. They have not waxed/waned or gone away at all. No other lesions anywhere else. Not really itchy or bothersome.   Review of Systems  Constitutional: Negative for fatigue.  HENT: Negative for congestion, postnasal drip, rhinorrhea and sore throat.   Eyes: Negative for visual disturbance.  Respiratory: Negative for shortness of breath.   Cardiovascular: Negative for chest pain, palpitations and leg swelling.  Gastrointestinal: Negative for abdominal pain, constipation, diarrhea, nausea and vomiting.  Endocrine: Positive for cold intolerance. Negative for heat intolerance, polydipsia and polyuria.  Genitourinary: Negative for dyspareunia, frequency and urgency.  Musculoskeletal: Negative for arthralgias, back pain, myalgias and neck pain.  Skin: Negative for rash.       C/o patches of dry skin on scalp  Neurological: Negative for dizziness and headaches.  Psychiatric/Behavioral: Negative for sleep disturbance. The patient is not nervous/anxious.     Allergies Patient is allergic to ibuprofen.  Past Medical History Patient  has a past medical history of Hepatitis at age 63, Hypothyroidism, and Miscarriage.  Surgical History Patient  has a past surgical history that includes Cesarean section; Wisdom tooth extraction; and Dilation and evacuation (06/09/2011).  Family History Pateint's family history includes Diabetes in  her father; Dysphagia in her mother; Hypertension in her father; Thyroid disease in her mother.  Social History Patient  reports that she has never smoked. She has never used smokeless tobacco. She reports that she does not drink alcohol or use drugs.    Objective: Vitals:   08/29/19 1444  BP: 114/68  Pulse: 84  Temp: 98.2 F (36.8 C)  TempSrc: Skin  SpO2: 99%  Weight: 154 lb (69.9 kg)  Height: 5\' 2"  (1.575 m)    Body mass index is 28.17 kg/m.  Physical Exam Vitals reviewed.  Constitutional:      Appearance: Normal appearance. She is well-developed and normal weight.  HENT:     Head: Normocephalic and atraumatic.     Right Ear: Tympanic membrane, ear canal and external ear normal.     Left Ear: Tympanic membrane, ear canal and external ear normal.     Nose: Nose normal.     Mouth/Throat:     Mouth: Mucous membranes are moist.  Eyes:     Extraocular Movements: Extraocular movements intact.     Conjunctiva/sclera: Conjunctivae normal.     Pupils: Pupils are equal, round, and reactive to light.  Neck:     Thyroid: No thyromegaly.  Cardiovascular:     Rate and Rhythm: Normal rate and regular rhythm.     Heart sounds: Normal heart sounds. No murmur.  Pulmonary:     Effort: Pulmonary effort is normal.     Breath sounds: Normal breath sounds.  Abdominal:     General: Abdomen is flat. Bowel sounds are normal. There is no distension.     Palpations: Abdomen is soft.  Tenderness: There is no abdominal tenderness.  Musculoskeletal:     Cervical back: Normal range of motion and neck supple.  Lymphadenopathy:     Cervical: No cervical adenopathy.  Skin:    General: Skin is warm and dry.     Findings: No rash.     Comments: Scalp: scabbed over lesion on crown and at base of neck. On erythematous base.   Neurological:     General: No focal deficit present.     Mental Status: She is alert and oriented to person, place, and time.     Cranial Nerves: No cranial nerve  deficit.     Coordination: Coordination normal.     Deep Tendon Reflexes: Reflexes normal.  Psychiatric:        Behavior: Behavior normal.        Depression screen [facility] 2/9 08/29/2019 05/10/2017 05/03/2016 04/25/2016  Decreased Interest 0 0 0 0  Down, Depressed, Hopeless 0 0 0 0  PHQ - 2 Score 0 0 0 0    Assessment/plan: 1. Acquired hypothyroidism Routine labs and then will fill medication.  - TSH; Future - T4, free; Future - Comprehensive metabolic panel; Future - CBC with Differential/Platelet; Future - CBC with Differential/Platelet - Comprehensive metabolic panel - TSH - T4, free  2. Vitamin D deficiency   3. Vitamin deficiency  - VITAMIN D 25 Hydroxy (Vit-D Deficiency, Fractures); Future - Vitamin B12; Future - Vitamin B12 - VITAMIN D 25 Hydroxy (Vit-D Deficiency, Fractures)  4. Screening cholesterol level  - Lipid panel; Future - Lipid panel  5. Dissecting folliculitis of scalp Trial of topical clindamycin, tea tree oil shampoo/nizoral. Will hold off on oral antibiotics since possibility of having another baby and she doesn't want to be on abx for 3 months. If not better we can send to derm. Discussed this will come and go.   -start PNV. Follow up with GYN for possible fertility treatment/work up.    This visit occurred during the SARS-CoV-2 public health emergency.  Safety protocols were in place, including screening questions prior to the visit, additional usage of staff PPE, and extensive cleaning of exam room while observing appropriate contact time as indicated for disinfecting solutions.    Return if symptoms worsen or fail to improve.   Orma Flaming, MD [place]   08/29/2019

## 2019-08-30 LAB — LIPID PANEL
Cholesterol: 257 mg/dL — ABNORMAL HIGH (ref <200)
HDL: 43 mg/dL — ABNORMAL LOW (ref >=50)
LDL Cholesterol (Calc): 173 mg/dL (calc) — ABNORMAL HIGH
Non-HDL Cholesterol (Calc): 214 mg/dL (calc) — ABNORMAL HIGH (ref <130)
Total CHOL/HDL Ratio: 6 (calc) — ABNORMAL HIGH (ref <5.0)
Triglycerides: 253 mg/dL — ABNORMAL HIGH (ref <150)

## 2019-08-30 LAB — COMPREHENSIVE METABOLIC PANEL
AG Ratio: 1.4 (calc) (ref 1.0–2.5)
ALT: 13 U/L (ref 6–29)
AST: 15 U/L (ref 10–35)
Albumin: 4.4 g/dL (ref 3.6–5.1)
Alkaline phosphatase (APISO): 79 U/L (ref 31–125)
BUN: 15 mg/dL (ref 7–25)
CO2: 25 mmol/L (ref 20–32)
Calcium: 9.3 mg/dL (ref 8.6–10.2)
Chloride: 101 mmol/L (ref 98–110)
Creat: 0.74 mg/dL (ref 0.50–1.10)
Globulin: 3.1 g/dL (calc) (ref 1.9–3.7)
Glucose, Bld: 97 mg/dL (ref 65–99)
Potassium: 4.4 mmol/L (ref 3.5–5.3)
Sodium: 138 mmol/L (ref 135–146)
Total Bilirubin: 0.2 mg/dL (ref 0.2–1.2)
Total Protein: 7.5 g/dL (ref 6.1–8.1)

## 2019-08-30 LAB — CBC WITH DIFFERENTIAL/PLATELET
Absolute Monocytes: 468 cells/uL (ref 200–950)
Basophils Absolute: 42 cells/uL (ref 0–200)
Basophils Relative: 0.4 %
Eosinophils Absolute: 156 cells/uL (ref 15–500)
Eosinophils Relative: 1.5 %
HCT: 38 % (ref 35.0–45.0)
Hemoglobin: 12.4 g/dL (ref 11.7–15.5)
Lymphs Abs: 2881 cells/uL (ref 850–3900)
MCH: 25.3 pg — ABNORMAL LOW (ref 27.0–33.0)
MCHC: 32.6 g/dL (ref 32.0–36.0)
MCV: 77.4 fL — ABNORMAL LOW (ref 80.0–100.0)
MPV: 9.6 fL (ref 7.5–12.5)
Monocytes Relative: 4.5 %
Neutro Abs: 6854 cells/uL (ref 1500–7800)
Neutrophils Relative %: 65.9 %
Platelets: 439 10*3/uL — ABNORMAL HIGH (ref 140–400)
RBC: 4.91 10*6/uL (ref 3.80–5.10)
RDW: 13.9 % (ref 11.0–15.0)
Total Lymphocyte: 27.7 %
WBC: 10.4 10*3/uL (ref 3.8–10.8)

## 2019-08-30 LAB — T4, FREE: Free T4: 1 ng/dL (ref 0.8–1.8)

## 2019-08-30 LAB — VITAMIN B12: Vitamin B-12: 570 pg/mL (ref 200–1100)

## 2019-08-30 LAB — TSH: TSH: 5.84 mIU/L — ABNORMAL HIGH

## 2019-08-30 LAB — VITAMIN D 25 HYDROXY (VIT D DEFICIENCY, FRACTURES): Vit D, 25-Hydroxy: 21 ng/mL — ABNORMAL LOW (ref 30–100)

## 2019-09-01 ENCOUNTER — Other Ambulatory Visit: Payer: Self-pay | Admitting: Family Medicine

## 2019-09-01 ENCOUNTER — Encounter: Payer: Self-pay | Admitting: Family Medicine

## 2019-09-01 DIAGNOSIS — E782 Mixed hyperlipidemia: Secondary | ICD-10-CM

## 2019-09-01 DIAGNOSIS — R718 Other abnormality of red blood cells: Secondary | ICD-10-CM

## 2019-09-01 DIAGNOSIS — E785 Hyperlipidemia, unspecified: Secondary | ICD-10-CM | POA: Insufficient documentation

## 2019-09-01 DIAGNOSIS — E039 Hypothyroidism, unspecified: Secondary | ICD-10-CM

## 2019-09-01 MED ORDER — VITAMIN D (ERGOCALCIFEROL) 1.25 MG (50000 UNIT) PO CAPS: ORAL_CAPSULE | ORAL | 0 refills | Status: DC

## 2019-09-01 MED ORDER — LEVOTHYROXINE SODIUM 150 MCG PO TABS: 150.0000 ug | ORAL_TABLET | Freq: Every day | ORAL | 0 refills | Status: DC

## 2019-09-01 MED FILL — VIT D2 1.25 MG (50,000 UNIT: 1.25 MG | 84 days supply | Qty: 12 | Fill #0

## 2019-09-01 MED FILL — SYNTHROID 150 MCG TABLET: 150 | 90 days supply | Qty: 90 | Fill #0

## 2019-09-03 ENCOUNTER — Telehealth: Payer: Self-pay | Admitting: Family Medicine

## 2019-09-03 NOTE — Telephone Encounter (Signed)
Spoke to patient and cancelled 12/21 appt as patient does not need to be seen.

## 2019-09-03 NOTE — Telephone Encounter (Signed)
Copied from [place] [id number]. Topic: General - Other >> Sep 03, 2019 11:18 AM Keene Breath wrote: Reason for CRM: Patient would like the nurse to call regarding her upcoming appt.  CB# [phone]

## 2019-09-08 ENCOUNTER — Encounter: Payer: 59 | Admitting: Family Medicine

## 2019-10-06 ENCOUNTER — Other Ambulatory Visit: Payer: 59

## 2019-11-10 ENCOUNTER — Other Ambulatory Visit: Payer: 59

## 2019-12-05 ENCOUNTER — Other Ambulatory Visit: Payer: Self-pay

## 2019-12-05 ENCOUNTER — Other Ambulatory Visit: Payer: Self-pay | Admitting: Family Medicine

## 2019-12-05 DIAGNOSIS — R718 Other abnormality of red blood cells: Secondary | ICD-10-CM

## 2019-12-08 ENCOUNTER — Other Ambulatory Visit: Payer: 59

## 2019-12-17 ENCOUNTER — Telehealth: Payer: Self-pay | Admitting: Family Medicine

## 2019-12-17 ENCOUNTER — Other Ambulatory Visit: Payer: Self-pay | Admitting: Family Medicine

## 2019-12-17 NOTE — Telephone Encounter (Signed)
Just received refill request for her thyroid medication but she has not been in for her lab work. Please make sure she comes in so I can refill correct thyroid dosage.  Thanks,  Dr. Rogers Blocker

## 2019-12-17 NOTE — Telephone Encounter (Signed)
Spoke with pt to remind her of appointment for labs tomorrow, before refilling rx for Thyroid medication. Pt voiced understanding and is planning to be here.

## 2019-12-18 ENCOUNTER — Other Ambulatory Visit: Payer: Self-pay

## 2019-12-18 ENCOUNTER — Other Ambulatory Visit (INDEPENDENT_AMBULATORY_CARE_PROVIDER_SITE_OTHER): Payer: 59

## 2019-12-18 DIAGNOSIS — R718 Other abnormality of red blood cells: Secondary | ICD-10-CM

## 2019-12-19 LAB — IRON,TIBC AND FERRITIN PANEL
%SAT: 16 % (calc) (ref 16–45)
Ferritin: 8 ng/mL — ABNORMAL LOW (ref 16–232)
Iron: 53 ug/dL (ref 40–190)
TIBC: 329 mcg/dL (calc) (ref 250–450)

## 2019-12-22 ENCOUNTER — Other Ambulatory Visit: Payer: Self-pay | Admitting: Family Medicine

## 2019-12-22 ENCOUNTER — Other Ambulatory Visit: Payer: Self-pay

## 2019-12-22 DIAGNOSIS — E039 Hypothyroidism, unspecified: Secondary | ICD-10-CM

## 2019-12-22 DIAGNOSIS — R718 Other abnormality of red blood cells: Secondary | ICD-10-CM

## 2019-12-22 DIAGNOSIS — E559 Vitamin D deficiency, unspecified: Secondary | ICD-10-CM

## 2019-12-22 DIAGNOSIS — E782 Mixed hyperlipidemia: Secondary | ICD-10-CM

## 2019-12-22 NOTE — Progress Notes (Signed)
Please let her know that her lab work was not done by our error and that we are very sorry. Can she please come back to get the rest of her labs done? Only lab done was her iron panel and we need her thyroid, cbc and vitamin D as well as her fasting lipid panel.  im so sorry.  Dr. Rogers Blocker

## 2019-12-22 NOTE — Progress Notes (Signed)
Pt was called and given message below. Pt will come in on Wednesday to have labs done.

## 2019-12-24 ENCOUNTER — Other Ambulatory Visit: Payer: Self-pay

## 2019-12-24 ENCOUNTER — Other Ambulatory Visit (INDEPENDENT_AMBULATORY_CARE_PROVIDER_SITE_OTHER): Payer: 59

## 2019-12-24 DIAGNOSIS — E559 Vitamin D deficiency, unspecified: Secondary | ICD-10-CM | POA: Diagnosis not present

## 2019-12-24 DIAGNOSIS — E782 Mixed hyperlipidemia: Secondary | ICD-10-CM

## 2019-12-24 DIAGNOSIS — E039 Hypothyroidism, unspecified: Secondary | ICD-10-CM

## 2019-12-24 DIAGNOSIS — R718 Other abnormality of red blood cells: Secondary | ICD-10-CM

## 2019-12-24 LAB — CBC WITH DIFFERENTIAL/PLATELET
Basophils Absolute: 0.1 10*3/uL (ref 0.0–0.1)
Basophils Relative: 0.5 % (ref 0.0–3.0)
Eosinophils Absolute: 0.3 10*3/uL (ref 0.0–0.7)
Eosinophils Relative: 3.1 % (ref 0.0–5.0)
HCT: 38.6 % (ref 36.0–46.0)
Hemoglobin: 12.7 g/dL (ref 12.0–15.0)
Lymphocytes Relative: 27.2 % (ref 12.0–46.0)
Lymphs Abs: 2.9 10*3/uL (ref 0.7–4.0)
MCHC: 33 g/dL (ref 30.0–36.0)
MCV: 74.3 fl — ABNORMAL LOW (ref 78.0–100.0)
Monocytes Absolute: 0.5 10*3/uL (ref 0.1–1.0)
Monocytes Relative: 4.6 % (ref 3.0–12.0)
Neutro Abs: 6.8 10*3/uL (ref 1.4–7.7)
Neutrophils Relative %: 64.6 % (ref 43.0–77.0)
Platelets: 452 10*3/uL — ABNORMAL HIGH (ref 150.0–400.0)
RBC: 5.19 Mil/uL — ABNORMAL HIGH (ref 3.87–5.11)
RDW: 15.1 % (ref 11.5–15.5)
WBC: 10.5 10*3/uL (ref 4.0–10.5)

## 2019-12-24 LAB — LIPID PANEL
Cholesterol: 259 mg/dL — ABNORMAL HIGH (ref 0–200)
HDL: 46.8 mg/dL (ref >39.00)
LDL Cholesterol: 181 mg/dL — ABNORMAL HIGH (ref 0–99)
NonHDL: 212.62
Total CHOL/HDL Ratio: 6
Triglycerides: 159 mg/dL — ABNORMAL HIGH (ref 0.0–149.0)
VLDL: 31.8 mg/dL (ref 0.0–40.0)

## 2019-12-24 LAB — TSH: TSH: 0.3 u[IU]/mL — ABNORMAL LOW (ref 0.35–4.50)

## 2019-12-24 LAB — T4, FREE: Free T4: 0.84 ng/dL (ref 0.60–1.60)

## 2019-12-24 LAB — VITAMIN D 25 HYDROXY (VIT D DEFICIENCY, FRACTURES): VITD: 34.71 ng/mL (ref 30.00–100.00)

## 2019-12-25 ENCOUNTER — Other Ambulatory Visit: Payer: Self-pay | Admitting: Family Medicine

## 2019-12-25 MED ORDER — LEVOTHYROXINE SODIUM 137 MCG PO TABS: 137.0000 ug | ORAL_TABLET | Freq: Every day | ORAL | 0 refills | Status: DC

## 2019-12-25 MED FILL — SYNTHROID 137 MCG TABLET: 137 | 90 days supply | Qty: 90 | Fill #0

## 2019-12-25 MED FILL — CLINDAMYCIN PH 1% SOLUTION: 1 | 30 days supply | Qty: 60 | Fill #1

## 2020-02-19 ENCOUNTER — Encounter: Payer: 59 | Admitting: Family Medicine

## 2020-03-24 ENCOUNTER — Telehealth: Payer: Self-pay

## 2020-03-24 NOTE — Telephone Encounter (Signed)
Please advise 

## 2020-03-24 NOTE — Telephone Encounter (Signed)
Pt has a physical scheduled this month, but doesn't feel she needs it because she has seen Dr. Rogers Blocker routinely. However, she wants to know if Dr. Rogers Blocker could still send in her prescription for her thyroid or if she needs to be seen prior to the thyroid medication being sent in.

## 2020-03-24 NOTE — Telephone Encounter (Signed)
My Chart message sent

## 2020-03-24 NOTE — Telephone Encounter (Signed)
She actually needs to be seen and her thyroid was abnormal back in April and she was supposed to come back in for lab check.  Orma Flaming, MD [place]

## 2020-03-26 ENCOUNTER — Encounter: Payer: 59 | Admitting: Family Medicine

## 2020-04-02 ENCOUNTER — Ambulatory Visit: Payer: 59 | Admitting: Family Medicine

## 2020-04-02 ENCOUNTER — Telehealth: Payer: Self-pay | Admitting: Family Medicine

## 2020-04-02 ENCOUNTER — Encounter: Payer: Self-pay | Admitting: Family Medicine

## 2020-04-02 ENCOUNTER — Other Ambulatory Visit: Payer: Self-pay

## 2020-04-02 VITALS — BP 100/60 | HR 74 | Temp 97.9°F | Ht 62.0 in | Wt 142.0 lb

## 2020-04-02 DIAGNOSIS — Z0000 Encounter for general adult medical examination without abnormal findings: Secondary | ICD-10-CM

## 2020-04-02 DIAGNOSIS — N926 Irregular menstruation, unspecified: Secondary | ICD-10-CM

## 2020-04-02 DIAGNOSIS — R7309 Other abnormal glucose: Secondary | ICD-10-CM | POA: Diagnosis not present

## 2020-04-02 DIAGNOSIS — E039 Hypothyroidism, unspecified: Secondary | ICD-10-CM | POA: Diagnosis not present

## 2020-04-02 LAB — CBC WITH DIFFERENTIAL/PLATELET
Basophils Absolute: 0 10*3/uL (ref 0.0–0.1)
Basophils Relative: 0.6 % (ref 0.0–3.0)
Eosinophils Absolute: 0.3 10*3/uL (ref 0.0–0.7)
Eosinophils Relative: 4 % (ref 0.0–5.0)
HCT: 40.3 % (ref 36.0–46.0)
Hemoglobin: 13.5 g/dL (ref 12.0–15.0)
Lymphocytes Relative: 24.3 % (ref 12.0–46.0)
Lymphs Abs: 2 10*3/uL (ref 0.7–4.0)
MCHC: 33.5 g/dL (ref 30.0–36.0)
MCV: 79.3 fl (ref 78.0–100.0)
Monocytes Absolute: 0.4 10*3/uL (ref 0.1–1.0)
Monocytes Relative: 5.3 % (ref 3.0–12.0)
Neutro Abs: 5.3 10*3/uL (ref 1.4–7.7)
Neutrophils Relative %: 65.8 % (ref 43.0–77.0)
Platelets: 350 10*3/uL (ref 150.0–400.0)
RBC: 5.09 Mil/uL (ref 3.87–5.11)
RDW: 14.9 % (ref 11.5–15.5)
WBC: 8.1 10*3/uL (ref 4.0–10.5)

## 2020-04-02 LAB — LIPID PANEL
Cholesterol: 218 mg/dL — ABNORMAL HIGH (ref 0–200)
HDL: 43.1 mg/dL (ref >39.00)
LDL Cholesterol: 144 mg/dL — ABNORMAL HIGH (ref 0–99)
NonHDL: 174.67
Total CHOL/HDL Ratio: 5
Triglycerides: 154 mg/dL — ABNORMAL HIGH (ref 0.0–149.0)
VLDL: 30.8 mg/dL (ref 0.0–40.0)

## 2020-04-02 LAB — COMPREHENSIVE METABOLIC PANEL
ALT: 10 U/L (ref 0–35)
AST: 13 U/L (ref 0–37)
Albumin: 4.4 g/dL (ref 3.5–5.2)
Alkaline Phosphatase: 83 U/L (ref 39–117)
BUN: 12 mg/dL (ref 6–23)
CO2: 28 mEq/L (ref 19–32)
Calcium: 9.4 mg/dL (ref 8.4–10.5)
Chloride: 102 mEq/L (ref 96–112)
Creatinine, Ser: 0.73 mg/dL (ref 0.40–1.20)
GFR: 85.35 mL/min (ref >60.00)
Glucose, Bld: 89 mg/dL (ref 70–99)
Potassium: 5.1 mEq/L (ref 3.5–5.1)
Sodium: 136 mEq/L (ref 135–145)
Total Bilirubin: 0.4 mg/dL (ref 0.2–1.2)
Total Protein: 7.3 g/dL (ref 6.0–8.3)

## 2020-04-02 LAB — HEMOGLOBIN A1C: Hgb A1c MFr Bld: 5.3 % (ref 4.6–6.5)

## 2020-04-02 LAB — T4, FREE: Free T4: 1.34 ng/dL (ref 0.60–1.60)

## 2020-04-02 LAB — POCT URINE PREGNANCY: Preg Test, Ur: NEGATIVE

## 2020-04-02 LAB — FSH/LH
FSH: 6.3 m[IU]/mL
LH: 5.5 m[IU]/mL

## 2020-04-02 LAB — TSH: TSH: 0.08 u[IU]/mL — ABNORMAL LOW (ref 0.35–4.50)

## 2020-04-02 NOTE — Progress Notes (Deleted)
Patient: Abigail Goodwin MRN: [id number] DOB: Nov 08, 1970 PCP: Orma Flaming, MD     Subjective:  Chief Complaint  Patient presents with  . Annual Exam  . Hypothyroidism  . microcytosis  . Amenorrhea    missed period x 2 months     HPI: The patient is a 49 y.o. female who presents today for annual exam. She denies any changes to past medical history. There have been no recent hospitalizations. They are  following a well balanced diet and exercise plan. Weight has been decreasing steadily. She would like to discuss lab work. Pt complains of weight loss, she says she has questions of where she needs be. She is also has missed her cycle for 2 months .   Immunization History  Administered Date(s) Administered  . Influenza,inj,Quad PF,6+ Mos 05/10/2017, 07/11/2019  . MMR 05/03/2016  . Tdap 04/25/2016   Colonoscopy: Mammogram:  Pap smear:  PSA:   Review of Systems  Respiratory: Negative for shortness of breath.   Cardiovascular: Negative for chest pain and palpitations.  Genitourinary: Negative for frequency and urgency.  Musculoskeletal: Negative for back pain.  Neurological: Negative for dizziness, light-headedness and headaches.    Allergies Patient is allergic to ibuprofen.  Past Medical History Patient  has a past medical history of Hepatitis at age 45, Hypothyroidism, and Miscarriage.  Surgical History Patient  has a past surgical history that includes Cesarean section; Wisdom tooth extraction; and Dilation and evacuation (06/09/2011).  Family History Pateint's family history includes Diabetes in her father; Dysphagia in her mother; Hypertension in her father; Thyroid disease in her mother.  Social History Patient  reports that she has never smoked. She has never used smokeless tobacco. She reports that she does not drink alcohol and does not use drugs.    Objective: Vitals:   04/02/20 0933  BP: 100/60  Pulse: 74  Temp: 97.9 F (36.6 C)  TempSrc: Temporal  SpO2:  99%  Weight: 142 lb (64.4 kg)  Height: '5\' 2"'  (1.575 m)    Body mass index is 25.97 kg/m.  Physical Exam     Assessment/plan:   No problem-specific Assessment & Plan notes found for this encounter.    Return in about 1 year (around 04/02/2021).     Orma Flaming, MD [place]  04/02/2020

## 2020-04-02 NOTE — Progress Notes (Signed)
Patient: Abigail Goodwin MRN: [id number] DOB: 1971/06/01 PCP: Orma Flaming, MD     Subjective:  Chief Complaint  Patient presents with  . Annual Exam  . Hypothyroidism  . microcytosis  . Amenorrhea    missed period x 2 months     HPI: The patient is a 49 y.o. female who presents today for annual exam. She denies any changes to past medical history. There have been no recent hospitalizations. They are following a well balanced diet and exercise plan. Weight has been decreasing steadily. No complaints today.   She has family history of breast cancer in her mother. No colon cancer.   Microcytosis: she has been on a daily iron.   Hypothyroidism: due for recheck   Hyperlipidemia She had labs done and had her cholesterol rechecked. Much better.  She has not had her period in 2 months. Unsure if perimenopausal. Still would love to have a baby.    Immunization History  Administered Date(s) Administered  . Influenza,inj,Quad PF,6+ Mos 05/10/2017, 07/11/2019  . MMR 05/03/2016  . Tdap 04/25/2016   Colonoscopy: discussed this with her, wants to wait.  Mammogram: 2 years ago. Sees Dr. Candie Mile  Pap smear: 2 years ago.    Review of Systems  Constitutional: Negative for chills, fatigue and fever.  HENT: Negative for dental problem, ear pain, hearing loss and trouble swallowing.   Eyes: Negative for visual disturbance.  Respiratory: Negative for cough, chest tightness and shortness of breath.   Cardiovascular: Negative for chest pain, palpitations and leg swelling.  Gastrointestinal: Negative for abdominal pain, blood in stool, diarrhea and nausea.  Endocrine: Negative for cold intolerance, polydipsia, polyphagia and polyuria.  Genitourinary: Positive for menstrual problem. Negative for dysuria and hematuria.  Musculoskeletal: Negative for arthralgias.  Skin: Negative for rash.  Neurological: Negative for dizziness and headaches.  Psychiatric/Behavioral: Negative for dysphoric mood  and sleep disturbance. The patient is not nervous/anxious.     Allergies Patient is allergic to ibuprofen.  Past Medical History Patient  has a past medical history of Hepatitis at age 34, Hypothyroidism, and Miscarriage.  Surgical History Patient  has a past surgical history that includes Cesarean section; Wisdom tooth extraction; and Dilation and evacuation (06/09/2011).  Family History Pateint's family history includes Diabetes in her father; Dysphagia in her mother; Hypertension in her father; Thyroid disease in her mother.  Social History Patient  reports that she has never smoked. She has never used smokeless tobacco. She reports that she does not drink alcohol and does not use drugs.    Objective: Vitals:   04/02/20 0933  BP: 100/60  Pulse: 74  Temp: 97.9 F (36.6 C)  TempSrc: Temporal  SpO2: 99%  Weight: 142 lb (64.4 kg)  Height: '5\' 2"'  (1.575 m)    Body mass index is 25.97 kg/m.  Physical Exam Vitals reviewed.  Constitutional:      Appearance: Normal appearance. She is well-developed and normal weight.  HENT:     Head: Normocephalic and atraumatic.     Right Ear: Tympanic membrane, ear canal and external ear normal.     Left Ear: Tympanic membrane, ear canal and external ear normal.     Nose: Nose normal.     Mouth/Throat:     Mouth: Mucous membranes are moist.  Eyes:     Extraocular Movements: Extraocular movements intact.     Conjunctiva/sclera: Conjunctivae normal.     Pupils: Pupils are equal, round, and reactive to light.  Neck:     Thyroid: No  thyromegaly.     Vascular: No carotid bruit.  Cardiovascular:     Rate and Rhythm: Normal rate and regular rhythm.     Pulses: Normal pulses.     Heart sounds: Normal heart sounds. No murmur heard.   Pulmonary:     Effort: Pulmonary effort is normal.     Breath sounds: Normal breath sounds.  Abdominal:     General: Abdomen is flat. Bowel sounds are normal. There is no distension.     Palpations: Abdomen  is soft.     Tenderness: There is no abdominal tenderness.  Musculoskeletal:     Cervical back: Normal range of motion and neck supple.  Lymphadenopathy:     Cervical: No cervical adenopathy.  Skin:    General: Skin is warm and dry.     Capillary Refill: Capillary refill takes less than 2 seconds.     Findings: No rash.  Neurological:     General: No focal deficit present.     Mental Status: She is alert and oriented to person, place, and time.     Cranial Nerves: No cranial nerve deficit.     Coordination: Coordination normal.     Deep Tendon Reflexes: Reflexes normal.  Psychiatric:        Mood and Affect: Mood normal.        Behavior: Behavior normal.      Office Visit from 04/02/2020 in [place]  PHQ-2 Total Score 0         Assessment/plan: 1. Annual physical exam Hm reviewed. She is overdue for mmg and discussed with fh really needs to get this done. Has gyn that she does this through. Discussed colonoscopy at age 41 years and she declined referral today. Otherwise utd. Routine fasting labs today. F/u in one year or as needed.  Patient counseling '[x]'    Nutrition: Stressed importance of moderation in sodium/caffeine intake, saturated fat and cholesterol, caloric balance, sufficient intake of fresh fruits, vegetables, fiber, calcium, iron, and 1 mg of folate supplement per day (for females capable of pregnancy).  '[x]'    Stressed the importance of regular exercise.   '[]'    Substance Abuse: Discussed cessation/primary prevention of tobacco, alcohol, or other drug use; driving or other dangerous activities under the influence; availability of treatment for abuse.   '[x]'    Injury prevention: Discussed safety belts, safety helmets, smoke detector, smoking near bedding or upholstery.   '[x]'    Sexuality: Discussed sexually transmitted diseases, partner selection, use of condoms, avoidance of unintended pregnancy  and contraceptive alternatives.  '[x]'    Dental  health: Discussed importance of regular tooth brushing, flossing, and dental visits.  '[x]'    Health maintenance and immunizations reviewed. Please refer to Health maintenance section.    - CBC with Differential/Platelet - Comprehensive metabolic panel - Lipid panel  2. Acquired hypothyroidism  - TSH - T4, free  3. Missed period  - FSH/LH - POCT urine pregnancy  Hx of prediabetes x 1 a1c years ago at 5.7, would like a1c checked today. Will add this on.    This visit occurred during the SARS-CoV-2 public health emergency.  Safety protocols were in place, including screening questions prior to the visit, additional usage of staff PPE, and extensive cleaning of exam room while observing appropriate contact time as indicated for disinfecting solutions.    Return in about 1 year (around 04/02/2021).    Orma Flaming, MD [place]  04/02/2020

## 2020-04-02 NOTE — Telephone Encounter (Signed)
Pt can't access lab results and would like a call back.

## 2020-04-02 NOTE — Patient Instructions (Addendum)
-checking labs and labs for perimenopause/pregnancy test  -thyroid labs If all normal, you are good for a year!  You look great, very proud of your weight loss!   Keep up the good work!  Dr. Rogers Blocker    Preventive Care 47-47 Years Old, Female Preventive care refers to visits with your health care provider and lifestyle choices that can promote health and wellness. This includes:  A yearly physical exam. This may also be called an annual well check.  Regular dental visits and eye exams.  Immunizations.  Screening for certain conditions.  Healthy lifestyle choices, such as eating a healthy diet, getting regular exercise, not using drugs or products that contain nicotine and tobacco, and limiting alcohol use. What can I expect for my preventive care visit? Physical exam Your health care provider will check your:  Height and weight. This may be used to calculate body mass index (BMI), which tells if you are at a healthy weight.  Heart rate and blood pressure.  Skin for abnormal spots. Counseling Your health care provider may ask you questions about your:  Alcohol, tobacco, and drug use.  Emotional well-being.  Home and relationship well-being.  Sexual activity.  Eating habits.  Work and work Statistician.  Method of birth control.  Menstrual cycle.  Pregnancy history. What immunizations do I need?  Influenza (flu) vaccine  This is recommended every year. Tetanus, diphtheria, and pertussis (Tdap) vaccine  You may need a Td booster every 10 years. Varicella (chickenpox) vaccine  You may need this if you have not been vaccinated. Zoster (shingles) vaccine  You may need this after age 46. Measles, mumps, and rubella (MMR) vaccine  You may need at least one dose of MMR if you were born in 1957 or later. You may also need a second dose. Pneumococcal conjugate (PCV13) vaccine  You may need this if you have certain conditions and were not previously  vaccinated. Pneumococcal polysaccharide (PPSV23) vaccine  You may need one or two doses if you smoke cigarettes or if you have certain conditions. Meningococcal conjugate (MenACWY) vaccine  You may need this if you have certain conditions. Hepatitis A vaccine  You may need this if you have certain conditions or if you travel or work in places where you may be exposed to hepatitis A. Hepatitis B vaccine  You may need this if you have certain conditions or if you travel or work in places where you may be exposed to hepatitis B. Haemophilus influenzae type b (Hib) vaccine  You may need this if you have certain conditions. Human papillomavirus (HPV) vaccine  If recommended by your health care provider, you may need three doses over 6 months. You may receive vaccines as individual doses or as more than one vaccine together in one shot (combination vaccines). Talk with your health care provider about the risks and benefits of combination vaccines. What tests do I need? Blood tests  Lipid and cholesterol levels. These may be checked every 5 years, or more frequently if you are over 51 years old.  Hepatitis C test.  Hepatitis B test. Screening  Lung cancer screening. You may have this screening every year starting at age 52 if you have a 30-pack-year history of smoking and currently smoke or have quit within the past 15 years.  Colorectal cancer screening. All adults should have this screening starting at age 36 and continuing until age 37. Your health care provider may recommend screening at age 38 if you are at increased risk.  will have tests every 1-10 years, depending on your results and the type of screening test.  Diabetes screening. This is done by checking your blood sugar (glucose) after you have not eaten for a while (fasting). You may have this done every 1-3 years.  Mammogram. This may be done every 1-2 years. Talk with your health care provider about when you should start  having regular mammograms. This may depend on whether you have a family history of breast cancer.  BRCA-related cancer screening. This may be done if you have a family history of breast, ovarian, tubal, or peritoneal cancers.  Pelvic exam and Pap test. This may be done every 3 years starting at age 47. Starting at age 30, this may be done every 5 years if you have a Pap test in combination with an HPV test. Other tests  Sexually transmitted disease (STD) testing.  Bone density scan. This is done to screen for osteoporosis. You may have this scan if you are at high risk for osteoporosis. Follow these instructions at home: Eating and drinking  Eat a diet that includes fresh fruits and vegetables, whole grains, lean protein, and low-fat dairy.  Take vitamin and mineral supplements as recommended by your health care provider.  Do not drink alcohol if: ? Your health care provider tells you not to drink. ? You are pregnant, may be pregnant, or are planning to become pregnant.  If you drink alcohol: ? Limit how much you have to 0-1 drink a day. ? Be aware of how much alcohol is in your drink. In the [place] one drink equals one 12 oz bottle of beer (355 mL), one 5 oz glass of wine (148 mL), or one 1 oz glass of hard liquor (44 mL). Lifestyle  Take daily care of your teeth and gums.  Stay active. Exercise for at least 30 minutes on 5 or more days each week.  Do not use any products that contain nicotine or tobacco, such as cigarettes, e-cigarettes, and chewing tobacco. If you need help quitting, ask your health care provider.  If you are sexually active, practice safe sex. Use a condom or other form of birth control (contraception) in order to prevent pregnancy and STIs (sexually transmitted infections).  If told by your health care provider, take low-dose aspirin daily starting at age 50. What's next?  Visit your health care provider once a year for a well check visit.  Ask your health  care provider how often you should have your eyes and teeth checked.  Stay up to date on all vaccines. This information is not intended to replace advice given to you by your health care provider. Make sure you discuss any questions you have with your health care provider. Document Revised: 05/16/2018 Document Reviewed: 05/16/2018 Elsevier Patient Education  2020 [employer] Inc[employer]  

## 2020-04-02 NOTE — Telephone Encounter (Signed)
Her appointment was today, Dr. Rogers Blocker has not gotten a chance to view them yet. Once she does someone from our office will give her a call.

## 2020-04-03 ENCOUNTER — Other Ambulatory Visit: Payer: Self-pay | Admitting: Family Medicine

## 2020-04-03 DIAGNOSIS — E039 Hypothyroidism, unspecified: Secondary | ICD-10-CM

## 2020-04-03 MED ORDER — LEVOTHYROXINE SODIUM 125 MCG PO TABS
125.0000 ug | ORAL_TABLET | Freq: Every day | ORAL | 0 refills | Status: DC
Start: 2020-04-03 — End: 2020-09-03

## 2020-04-03 MED FILL — SYNTHROID 125 MCG TABLET: 125 | 90 days supply | Qty: 90 | Fill #0

## 2020-04-26 ENCOUNTER — Encounter: Payer: Self-pay | Admitting: Family Medicine

## 2020-08-16 ENCOUNTER — Other Ambulatory Visit: Payer: Self-pay | Admitting: Family Medicine

## 2020-08-16 NOTE — Telephone Encounter (Signed)
She has not come in for lab work and is way overdue. Her thyroid was off and needs labs before I can send in her thyroid medication to make sure she doesn't need a different dosage.  Orma Flaming, MD [place]

## 2020-08-18 ENCOUNTER — Other Ambulatory Visit: Payer: Self-pay

## 2020-08-18 DIAGNOSIS — E039 Hypothyroidism, unspecified: Secondary | ICD-10-CM

## 2020-08-18 NOTE — Telephone Encounter (Signed)
Spoke with pt to give message below. Pt is scheduled for labs tomorrow.

## 2020-08-19 ENCOUNTER — Other Ambulatory Visit: Payer: 59

## 2020-09-02 ENCOUNTER — Other Ambulatory Visit: Payer: 59

## 2020-09-02 ENCOUNTER — Telehealth: Payer: Self-pay

## 2020-09-02 ENCOUNTER — Other Ambulatory Visit: Payer: Self-pay

## 2020-09-02 DIAGNOSIS — E039 Hypothyroidism, unspecified: Secondary | ICD-10-CM | POA: Diagnosis not present

## 2020-09-02 NOTE — Telephone Encounter (Signed)
Pt was in office getting labs drawn. Pt wanted to ask if Dr. Rogers Blocker would send in medication as soon as her results come back tomorrow so that she will have medication for the weekend. Please advise.

## 2020-09-02 NOTE — Telephone Encounter (Signed)
I spoke with the pt to let her know that when her labs have been reviewed, someone from our office will call with results. Medications will not be sent until labs are reviewed by PCP.

## 2020-09-03 ENCOUNTER — Other Ambulatory Visit: Payer: Self-pay | Admitting: Family Medicine

## 2020-09-03 DIAGNOSIS — E039 Hypothyroidism, unspecified: Secondary | ICD-10-CM

## 2020-09-03 DIAGNOSIS — H5213 Myopia, bilateral: Secondary | ICD-10-CM | POA: Diagnosis not present

## 2020-09-03 LAB — T4, FREE: Free T4: 1.1 ng/dL (ref 0.8–1.8)

## 2020-09-03 LAB — TSH: TSH: 15.87 mIU/L — ABNORMAL HIGH

## 2020-09-03 MED ORDER — LEVOTHYROXINE SODIUM 150 MCG PO TABS: 150.0000 ug | ORAL_TABLET | Freq: Every day | ORAL | 0 refills | Status: DC

## 2020-09-03 MED FILL — SYNTHROID 150 MCG TABLET: 150 | 90 days supply | Qty: 90 | Fill #0

## 2020-12-03 ENCOUNTER — Other Ambulatory Visit (INDEPENDENT_AMBULATORY_CARE_PROVIDER_SITE_OTHER): Payer: 59

## 2020-12-03 ENCOUNTER — Other Ambulatory Visit: Payer: Self-pay

## 2020-12-03 DIAGNOSIS — E039 Hypothyroidism, unspecified: Secondary | ICD-10-CM

## 2020-12-03 LAB — TSH: TSH: 0.04 u[IU]/mL — ABNORMAL LOW (ref 0.35–4.50)

## 2020-12-03 LAB — T4, FREE: Free T4: 1.32 ng/dL (ref 0.60–1.60)

## 2020-12-06 ENCOUNTER — Other Ambulatory Visit: Payer: Self-pay

## 2020-12-06 ENCOUNTER — Other Ambulatory Visit: Payer: Self-pay | Admitting: Family Medicine

## 2020-12-06 ENCOUNTER — Telehealth: Payer: Self-pay

## 2020-12-06 MED ORDER — LEVOTHYROXINE SODIUM 137 MCG PO TABS: 137.0000 ug | ORAL_TABLET | Freq: Every day | ORAL | 0 refills | Status: DC

## 2020-12-06 MED ORDER — LEVOTHYROXINE SODIUM 150 MCG PO TABS
150.0000 ug | ORAL_TABLET | Freq: Every day | ORAL | 0 refills | Status: DC
Start: 2020-12-06 — End: 2020-12-06

## 2020-12-06 MED FILL — SYNTHROID 137 MCG TABLET: 137 | 90 days supply | Qty: 90 | Fill #0

## 2020-12-06 NOTE — Telephone Encounter (Signed)
..   LAST APPOINTMENT DATE: 09/02/2020   NEXT APPOINTMENT DATE:@7 /18/2022  MEDICATION:levothyroxine (SYNTHROID) 150 MCG tablet

## 2020-12-06 NOTE — Telephone Encounter (Signed)
Sent to pharmacy 

## 2020-12-07 ENCOUNTER — Other Ambulatory Visit: Payer: Self-pay

## 2020-12-07 DIAGNOSIS — E039 Hypothyroidism, unspecified: Secondary | ICD-10-CM

## 2020-12-08 ENCOUNTER — Other Ambulatory Visit (HOSPITAL_BASED_OUTPATIENT_CLINIC_OR_DEPARTMENT_OTHER): Payer: Self-pay

## 2021-01-17 ENCOUNTER — Other Ambulatory Visit (HOSPITAL_COMMUNITY): Payer: Self-pay

## 2021-01-17 ENCOUNTER — Other Ambulatory Visit: Payer: Self-pay

## 2021-01-17 ENCOUNTER — Other Ambulatory Visit (INDEPENDENT_AMBULATORY_CARE_PROVIDER_SITE_OTHER): Payer: 59

## 2021-01-17 ENCOUNTER — Other Ambulatory Visit: Payer: Self-pay | Admitting: Family Medicine

## 2021-01-17 DIAGNOSIS — E039 Hypothyroidism, unspecified: Secondary | ICD-10-CM | POA: Diagnosis not present

## 2021-01-17 LAB — TSH: TSH: 4.16 u[IU]/mL (ref 0.35–4.50)

## 2021-01-17 MED ORDER — LEVOTHYROXINE SODIUM 137 MCG PO TABS
ORAL_TABLET | ORAL | 0 refills | Status: DC
  Filled 2021-01-17 – 2021-04-28 (×2): qty 90, 90d supply, fill #0

## 2021-02-26 ENCOUNTER — Other Ambulatory Visit (HOSPITAL_COMMUNITY): Payer: Self-pay

## 2021-02-26 MED ORDER — TIZANIDINE HCL 2 MG PO TABS
ORAL_TABLET | ORAL | 0 refills | Status: DC
  Filled 2021-02-26: qty 60, 30d supply, fill #0

## 2021-02-28 ENCOUNTER — Other Ambulatory Visit (HOSPITAL_COMMUNITY): Payer: Self-pay

## 2021-04-04 ENCOUNTER — Encounter: Payer: 59 | Admitting: Family Medicine

## 2021-04-28 ENCOUNTER — Other Ambulatory Visit (HOSPITAL_COMMUNITY): Payer: Self-pay

## 2021-05-18 ENCOUNTER — Other Ambulatory Visit (HOSPITAL_COMMUNITY): Payer: Self-pay

## 2021-05-18 ENCOUNTER — Other Ambulatory Visit: Payer: Self-pay | Admitting: Family Medicine

## 2021-05-19 ENCOUNTER — Other Ambulatory Visit (HOSPITAL_COMMUNITY): Payer: Self-pay

## 2021-05-31 DIAGNOSIS — Z20822 Contact with and (suspected) exposure to covid-19: Secondary | ICD-10-CM | POA: Diagnosis not present

## 2021-07-01 ENCOUNTER — Ambulatory Visit: Payer: 59 | Admitting: Physician Assistant

## 2021-10-03 ENCOUNTER — Other Ambulatory Visit (HOSPITAL_COMMUNITY): Payer: Self-pay

## 2021-10-03 ENCOUNTER — Other Ambulatory Visit: Payer: Self-pay | Admitting: Family Medicine

## 2021-10-04 ENCOUNTER — Other Ambulatory Visit (HOSPITAL_COMMUNITY): Payer: Self-pay

## 2021-10-07 ENCOUNTER — Other Ambulatory Visit: Payer: Self-pay

## 2021-10-07 ENCOUNTER — Other Ambulatory Visit (HOSPITAL_COMMUNITY): Payer: Self-pay

## 2021-10-07 ENCOUNTER — Encounter: Payer: Self-pay | Admitting: Family Medicine

## 2021-10-07 ENCOUNTER — Ambulatory Visit: Payer: 59 | Admitting: Family Medicine

## 2021-10-07 VITALS — BP 118/70 | HR 68 | Temp 98.3°F | Ht 62.0 in | Wt 154.5 lb

## 2021-10-07 DIAGNOSIS — E063 Autoimmune thyroiditis: Secondary | ICD-10-CM | POA: Diagnosis not present

## 2021-10-07 DIAGNOSIS — E038 Other specified hypothyroidism: Secondary | ICD-10-CM | POA: Diagnosis not present

## 2021-10-07 DIAGNOSIS — E7800 Pure hypercholesterolemia, unspecified: Secondary | ICD-10-CM | POA: Diagnosis not present

## 2021-10-07 DIAGNOSIS — E559 Vitamin D deficiency, unspecified: Secondary | ICD-10-CM

## 2021-10-07 DIAGNOSIS — Z789 Other specified health status: Secondary | ICD-10-CM | POA: Diagnosis not present

## 2021-10-07 LAB — COMPREHENSIVE METABOLIC PANEL
ALT: 14 U/L (ref 0–35)
AST: 14 U/L (ref 0–37)
Albumin: 4.2 g/dL (ref 3.5–5.2)
Alkaline Phosphatase: 75 U/L (ref 39–117)
BUN: 16 mg/dL (ref 6–23)
CO2: 28 mEq/L (ref 19–32)
Calcium: 9.3 mg/dL (ref 8.4–10.5)
Chloride: 103 mEq/L (ref 96–112)
Creatinine, Ser: 0.77 mg/dL (ref 0.40–1.20)
GFR: 90.97 mL/min (ref >60.00)
Glucose, Bld: 81 mg/dL (ref 70–99)
Potassium: 5 mEq/L (ref 3.5–5.1)
Sodium: 138 mEq/L (ref 135–145)
Total Bilirubin: 0.2 mg/dL (ref 0.2–1.2)
Total Protein: 7.4 g/dL (ref 6.0–8.3)

## 2021-10-07 LAB — LIPID PANEL
Cholesterol: 255 mg/dL — ABNORMAL HIGH (ref 0–200)
HDL: 48.7 mg/dL (ref >39.00)
LDL Cholesterol: 169 mg/dL — ABNORMAL HIGH (ref 0–99)
NonHDL: 206.66
Total CHOL/HDL Ratio: 5
Triglycerides: 186 mg/dL — ABNORMAL HIGH (ref 0.0–149.0)
VLDL: 37.2 mg/dL (ref 0.0–40.0)

## 2021-10-07 LAB — TSH: TSH: 17.78 u[IU]/mL — ABNORMAL HIGH (ref 0.35–5.50)

## 2021-10-07 LAB — CBC
HCT: 38.9 % (ref 36.0–46.0)
Hemoglobin: 12.4 g/dL (ref 12.0–15.0)
MCHC: 31.8 g/dL (ref 30.0–36.0)
MCV: 79.5 fl (ref 78.0–100.0)
Platelets: 396 10*3/uL (ref 150.0–400.0)
RBC: 4.9 Mil/uL (ref 3.87–5.11)
RDW: 16.1 % — ABNORMAL HIGH (ref 11.5–15.5)
WBC: 7.8 10*3/uL (ref 4.0–10.5)

## 2021-10-07 LAB — VITAMIN B12: Vitamin B-12: 334 pg/mL (ref 211–911)

## 2021-10-07 LAB — VITAMIN D 25 HYDROXY (VIT D DEFICIENCY, FRACTURES): VITD: 26.39 ng/mL — ABNORMAL LOW (ref 30.00–100.00)

## 2021-10-07 MED ORDER — CLINDAMYCIN PHOSPHATE 1 % EX SOLN
Freq: Two times a day (BID) | CUTANEOUS | 3 refills | Status: DC
  Filled 2021-10-07: qty 60, 30d supply, fill #0

## 2021-10-07 MED ORDER — LEVOTHYROXINE SODIUM 137 MCG PO TABS
137.0000 ug | ORAL_TABLET | Freq: Every day | ORAL | 3 refills | Status: DC
  Filled 2021-10-07: qty 90, 90d supply, fill #0

## 2021-10-07 NOTE — Patient Instructions (Signed)
It was very nice to see you today!  See you in 1 yr   PLEASE NOTE:  If you had any lab tests please let us know if you have not heard back within a few days. You may see your results on MyChart before we have a chance to review them but we will give you a call once they are reviewed by [place]. If we ordered any referrals today, please let us know if you have not heard from their office within the next week.   Please try these tips to maintain a healthy lifestyle:  Eat most of your calories during the day when you are active. Eliminate processed foods including packaged sweets (pies, cakes, cookies), reduce intake of potatoes, white bread, white pasta, and white rice. Look for whole grain options, oat flour or almond flour.  Each meal should contain half fruits/vegetables, one quarter protein, and one quarter carbs (no bigger than a computer mouse).  Cut down on sweet beverages. This includes juice, soda, and sweet tea. Also watch fruit intake, though this is a healthier sweet option, it still contains natural sugar! Limit to 3 servings daily.  Drink at least 1 glass of water with each meal and aim for at least 8 glasses per day  Exercise at least 150 minutes every week.

## 2021-10-07 NOTE — Progress Notes (Signed)
Subjective:     Patient ID: Abigail Goodwin, female    DOB: December 07, 1970, 51 y.o.   MRN: [id number]  Chief Complaint  Patient presents with   Transfer of Care    Fasting for labs     Dry scalp    Previously used clindamycin phosphate topical solution 1%    HPI  Hypothyroidism-doing well.  Has gained some wt recently. Not exercising/dieting.  Walks dog.   Skipped menses nov/dec.  But ok now.  Just pain. LMP in Jan Dryness on scalp and needs refill. Not taking supplements-pt vegetarian  Gyn-sees Dr. Ventura Bruns call for  appt  Health Maintenance Due  Topic Date Due   MAMMOGRAM  Never done   COLONOSCOPY (Pts 45-38yrs Insurance coverage will need to be confirmed)  Never done   PAP SMEAR-Modifier  12/09/2019    Past Medical History:  Diagnosis Date   Hepatitis at age 37    Hypothyroidism    Miscarriage     Past Surgical History:  Procedure Laterality Date   CESAREAN SECTION     DILATION AND EVACUATION  06/09/2011   Procedure: DILATATION AND EVACUATION (D&E) 2ND TRIMESTER;  Surgeon: Cyril Mourning, MD;  Location: [facility];  Service: Gynecology;  Laterality: N/A;  14 weeks/ultrasound and chromosome studies   WISDOM TOOTH EXTRACTION      Outpatient Medications Prior to Visit  Medication Sig Dispense Refill   ferrous sulfate 325 (65 FE) MG tablet Take 325 mg by mouth daily with breakfast.     levothyroxine (SYNTHROID) 137 MCG tablet TAKE 1 TABLET BY MOUTH EVERY MORNING BEFORE BREAKFAST. RECHECK LABS IN 6-8 WEEKS 90 tablet 0   B Complex Vitamins (B COMPLEX 1 PO) Take by mouth. (Patient not taking: Reported on 10/07/2021)     Cholecalciferol (VITAMIN D) 50 MCG (2000 UT) CAPS Take by mouth. (Patient not taking: Reported on 10/07/2021)     clindamycin (CLEOCIN-T) 1 % external solution Apply topically 2 (two) times daily. (Patient not taking: Reported on 04/02/2020) 60 mL 3   Omega-3 Fatty Acids (FISH OIL PO) Take by mouth. 1400 mg (total) /Twice daily (Patient not taking: Reported on  10/07/2021)     tiZANidine (ZANAFLEX) 2 MG tablet Take 1 (one) tablet by mouth 2 times daily as needed 60 tablet 0   Vitamin D, Ergocalciferol, (DRISDOL) 1.25 MG (50000 UT) CAPS capsule One capsule by mouth once a week for 12 weeks. Then take 2000IU/day 12 capsule 0   No facility-administered medications prior to visit.    Allergies  Allergen Reactions   Ibuprofen Swelling    Swelling/rash on face   Ibuprofen Rash and Swelling    Swelling/rash on face   JKK:[id number] except as noted in HPI      Objective:     BP 118/70    Pulse 68    Temp 98.3 F (36.8 C) (Temporal)    Ht 5\' 2"  (1.575 m)    Wt 154 lb 8 oz (70.1 kg)    LMP 09/25/2021 (Approximate)    SpO2 98%    BMI 28.26 kg/m  Wt Readings from Last 3 Encounters:  10/07/21 154 lb 8 oz (70.1 kg)  04/02/20 142 lb (64.4 kg)  08/29/19 154 lb (69.9 kg)        Gen: WDWN NAD HEENT: NCAT, conjunctiva not injected, sclera nonicteric NECK:  supple, no thyromegaly, no nodes, no carotid bruits CARDIAC: RRR, S1S2+, no murmur. DP 2+B LUNGS: CTAB. No wheezes ABDOMEN:  BS+, soft, NTND, No HSM, no  masses EXT:  no edema MSK: no gross abnormalities.  NEURO: A&O x3.  CN II-XII intact.  PSYCH: normal mood. Good eye contact  Assessment & Plan:   Problem List Items Addressed This Visit       Endocrine   Hypothyroidism - Primary     Other   Vitamin D deficiency   Hyperlipidemia   Vegetarian diet    Hashimotos-gaining wt.  Cont meds.  Check labs Hyperlipidemia-work on TLC-check labs Low D-not taking supps-check labs Vegetarian-not taking supps-check B12/D Scalp-doing well on meds. Cont  F/u yearly and prn No orders of the defined types were placed in this encounter.   Wellington Hampshire, MD

## 2021-10-10 ENCOUNTER — Telehealth: Payer: Self-pay

## 2021-10-10 ENCOUNTER — Other Ambulatory Visit (HOSPITAL_COMMUNITY): Payer: Self-pay

## 2021-10-10 ENCOUNTER — Other Ambulatory Visit: Payer: Self-pay | Admitting: Family Medicine

## 2021-10-10 DIAGNOSIS — E038 Other specified hypothyroidism: Secondary | ICD-10-CM

## 2021-10-10 MED ORDER — LEVOTHYROXINE SODIUM 175 MCG PO TABS
175.0000 ug | ORAL_TABLET | Freq: Every day | ORAL | 3 refills | Status: DC
  Filled 2021-10-10: qty 90, 90d supply, fill #0

## 2021-10-10 NOTE — Progress Notes (Signed)
Ordered increase in levothyroxine.  Needs to check labs 6-8 wks

## 2021-10-10 NOTE — Telephone Encounter (Signed)
Patient states she has reviewed her labs and is requesting an increase in her thyroid medication.  I have advised patient that Dr. Cherlynn Kaiser still needs to review labs.   Patient states she is out of medication and is concerned.

## 2021-10-17 ENCOUNTER — Telehealth: Payer: Self-pay

## 2021-10-17 NOTE — Telephone Encounter (Signed)
Patient states she was seen by Dr. Cherlynn Kaiser for depression.  States Dr. Cherlynn Kaiser had suggested medication.  Patient states she has spoken with her spouse in regard and would like to try a minimal dose of lexepro for a month.  Would like script sent to [facility].

## 2021-10-18 ENCOUNTER — Other Ambulatory Visit (HOSPITAL_COMMUNITY): Payer: Self-pay

## 2021-10-18 NOTE — Telephone Encounter (Signed)
Patient has been scheduled

## 2021-10-19 ENCOUNTER — Other Ambulatory Visit (HOSPITAL_COMMUNITY): Payer: Self-pay

## 2021-10-19 ENCOUNTER — Telehealth: Payer: 59 | Admitting: Family Medicine

## 2021-10-19 ENCOUNTER — Encounter: Payer: Self-pay | Admitting: Family Medicine

## 2021-10-19 ENCOUNTER — Other Ambulatory Visit: Payer: Self-pay

## 2021-10-19 ENCOUNTER — Telehealth (INDEPENDENT_AMBULATORY_CARE_PROVIDER_SITE_OTHER): Payer: 59 | Admitting: Family Medicine

## 2021-10-19 DIAGNOSIS — F4321 Adjustment disorder with depressed mood: Secondary | ICD-10-CM | POA: Diagnosis not present

## 2021-10-19 MED ORDER — ESCITALOPRAM OXALATE 5 MG PO TABS
5.0000 mg | ORAL_TABLET | Freq: Every day | ORAL | 1 refills | Status: DC
  Filled 2021-10-19: qty 30, 30d supply, fill #0

## 2021-10-19 NOTE — Patient Instructions (Signed)
It was very nice to see you today!  For the Lexapro-start with 1/2 tab per day for 1 week then increase to whole tab   PLEASE NOTE:  If you had any lab tests please let us know if you have not heard back within a few days. You may see your results on MyChart before we have a chance to review them but we will give you a call once they are reviewed by [place]. If we ordered any referrals today, please let us know if you have not heard from their office within the next week.   Please try these tips to maintain a healthy lifestyle:  Eat most of your calories during the day when you are active. Eliminate processed foods including packaged sweets (pies, cakes, cookies), reduce intake of potatoes, white bread, white pasta, and white rice. Look for whole grain options, oat flour or almond flour.  Each meal should contain half fruits/vegetables, one quarter protein, and one quarter carbs (no bigger than a computer mouse).  Cut down on sweet beverages. This includes juice, soda, and sweet tea. Also watch fruit intake, though this is a healthier sweet option, it still contains natural sugar! Limit to 3 servings daily.  Drink at least 1 glass of water with each meal and aim for at least 8 glasses per day  Exercise at least 150 minutes every week.

## 2021-10-19 NOTE — Progress Notes (Signed)
MyChart Video Visit    Virtual Visit via Video Note   This visit type was conducted due to national recommendations for restrictions regarding the COVID-19 Pandemic (e.g. social distancing) in an effort to limit this patient's exposure and mitigate transmission in our community. This patient is at least at moderate risk for complications without adequate follow up. This format is felt to be most appropriate for this patient at this time. Physical exam was limited by quality of the video and audio technology used for the visit. CMA was able to get the patient set up on a video visit.  Patient location: Home. Patient and provider in visit Provider location: Office  I discussed the limitations of evaluation and management by telemedicine and the availability of in person appointments. The patient expressed understanding and agreed to proceed.  Visit Date: 10/19/2021  Today's healthcare provider: Wellington Hampshire, MD     Subjective:    Patient ID: Abigail Goodwin, female    DOB: 1971/08/11, 51 y.o.   MRN: [id number]  Chief Complaint  Patient presents with   Depression    Discuss depression and possible start of lexapro    HPI depression Worries about daughter since she went to college Low energy.  Some anhedonia.  Wants to sleep more. Not getting chores done.  Overwhelmed at times.  Daughter was home and told her she "looks sad".   Husband MD at hospital and suggested [place].  Denies SI  Past Medical History:  Diagnosis Date   Hepatitis at age 28    Hypothyroidism    Miscarriage     Past Surgical History:  Procedure Laterality Date   CESAREAN SECTION     DILATION AND EVACUATION  06/09/2011   Procedure: DILATATION AND EVACUATION (D&E) 2ND TRIMESTER;  Surgeon: Cyril Mourning, MD;  Location: [facility];  Service: Gynecology;  Laterality: N/A;  14 weeks/ultrasound and chromosome studies   WISDOM TOOTH EXTRACTION      Outpatient Medications Prior to Visit  Medication Sig Dispense  Refill   B Complex Vitamins (B COMPLEX 1 PO) Take by mouth.     Cholecalciferol (VITAMIN D) 50 MCG (2000 UT) CAPS Take by mouth.     clindamycin (CLEOCIN-T) 1 % external solution Apply topically 2 (two) times daily. 60 mL 3   ferrous sulfate 325 (65 FE) MG tablet Take 325 mg by mouth daily with breakfast.     levothyroxine (SYNTHROID) 175 MCG tablet Take 1 tablet by mouth daily. 90 tablet 3   Omega-3 Fatty Acids (FISH OIL PO) Take by mouth. 1400 mg (total) /Twice daily     No facility-administered medications prior to visit.    Allergies  Allergen Reactions   Ibuprofen Swelling    Swelling/rash on face   Ibuprofen Rash and Swelling    Swelling/rash on face        Objective:     Physical Exam Vitals and nursing note reviewed.  Constitutional:      General:  is not in acute distress.    Appearance: Normal appearance.  HENT:     Head: Normocephalic.  Pulmonary:     Effort: No respiratory distress.  Skin:    General: Skin is dry.     Coloration: Skin is not pale.  Neurological:     Mental Status: Pt is alert and oriented to person, place, and time.  Psychiatric:        Mood and Affect: Mood normal.   LMP 09/25/2021 (Approximate)   Wt Readings  from Last 3 Encounters:  10/07/21 154 lb 8 oz (70.1 kg)  04/02/20 142 lb (64.4 kg)  08/29/19 154 lb (69.9 kg)       Assessment & Plan:   Problem List Items Addressed This Visit   None Visit Diagnoses     Adjustment disorder with depressed mood    -  Primary      Adjustment disorder-trial lexapro.  SED  Meds ordered this encounter  Medications   escitalopram (LEXAPRO) 5 MG tablet    Sig: Take 1 tablet (5 mg total) by mouth daily.    Dispense:  30 tablet    Refill:  1    I discussed the assessment and treatment plan with the patient. The patient was provided an opportunity to ask questions and all were answered. The patient agreed with the plan and demonstrated an understanding of the instructions.   The patient  was advised to call back or seek an in-person evaluation if the symptoms worsen or if the condition fails to improve as anticipated.  I provided 15 minutes of face-to-face time during this encounter.   Wellington Hampshire, MD [place] [phone] (phone) [phone] (fax)  [place]

## 2021-11-07 ENCOUNTER — Ambulatory Visit: Payer: 59 | Admitting: Physician Assistant

## 2021-11-16 ENCOUNTER — Other Ambulatory Visit: Payer: Self-pay

## 2021-11-16 ENCOUNTER — Telehealth (INDEPENDENT_AMBULATORY_CARE_PROVIDER_SITE_OTHER): Payer: 59 | Admitting: Family Medicine

## 2021-11-16 ENCOUNTER — Encounter: Payer: Self-pay | Admitting: Family Medicine

## 2021-11-16 ENCOUNTER — Other Ambulatory Visit (HOSPITAL_COMMUNITY): Payer: Self-pay

## 2021-11-16 DIAGNOSIS — F4321 Adjustment disorder with depressed mood: Secondary | ICD-10-CM | POA: Diagnosis not present

## 2021-11-16 DIAGNOSIS — Z1231 Encounter for screening mammogram for malignant neoplasm of breast: Secondary | ICD-10-CM

## 2021-11-16 DIAGNOSIS — E039 Hypothyroidism, unspecified: Secondary | ICD-10-CM

## 2021-11-16 MED ORDER — ESCITALOPRAM OXALATE 10 MG PO TABS
10.0000 mg | ORAL_TABLET | Freq: Every day | ORAL | 1 refills | Status: DC
  Filled 2021-11-16: qty 90, 90d supply, fill #0
  Filled 2022-02-21: qty 90, 90d supply, fill #1

## 2021-11-16 NOTE — Progress Notes (Signed)
? ? ?MyChart Video Visit ? ? ? ?Virtual Visit via Video Note  ? ?This visit type was conducted due to national recommendations for restrictions regarding the COVID-19 Pandemic (e.g. social distancing) in an effort to limit this patient's exposure and mitigate transmission in our community. This patient is at least at moderate risk for complications without adequate follow up. This format is felt to be most appropriate for this patient at this time. Physical exam was limited by quality of the video and audio technology used for the visit. CMA was able to get the patient set up on a video visit. ? ?Patient location: Home. Patient and provider in visit ?Provider location: Office ? ?I discussed the limitations of evaluation and management by telemedicine and the availability of in person appointments. The patient expressed understanding and agreed to proceed. ? ?Visit Date: 11/16/2021 ? ?Today's healthcare provider: Wellington Hampshire, MD  ? ? ? ?Subjective:  ? ? Patient ID: Abigail Goodwin, female    DOB: Feb 26, 1971, 51 y.o.   MRN: [id number] ? ?Chief Complaint  ?Patient presents with  ? Follow-up  ?  4 week follow-up on medication ?Doing better with waking up in the morning on medication  ? ? ?HPI ?F/u depression ?Doing better on meds.  No SI.  No SE. Not as anxious.  Sleeping better-waking up more refreshed.  Requesting dose increase to 10mg  ? ?2.  Hypothyroidism-still on the 175.  No jittery/palp/tremors.  ? ?Past Medical History:  ?Diagnosis Date  ? Hepatitis at age 81   ? Hypothyroidism   ? Miscarriage   ? ? ?Past Surgical History:  ?Procedure Laterality Date  ? CESAREAN SECTION    ? DILATION AND EVACUATION  06/09/2011  ? Procedure: DILATATION AND EVACUATION (D&E) 2ND TRIMESTER;  Surgeon: Cyril Mourning, MD;  Location: [facility];  Service: Gynecology;  Laterality: N/A;  14 weeks/ultrasound and chromosome studies  ? WISDOM TOOTH EXTRACTION    ? ? ?Outpatient Medications Prior to Visit  ?Medication Sig Dispense Refill  ? B Complex  Vitamins (B COMPLEX 1 PO) Take by mouth.    ? Cholecalciferol (VITAMIN D) 50 MCG (2000 UT) CAPS Take by mouth.    ? clindamycin (CLEOCIN-T) 1 % external solution Apply topically 2 (two) times daily. 60 mL 3  ? escitalopram (LEXAPRO) 5 MG tablet Take 1 tablet (5 mg total) by mouth daily. 30 tablet 1  ? ferrous sulfate 325 (65 FE) MG tablet Take 325 mg by mouth daily with breakfast.    ? Omega-3 Fatty Acids (FISH OIL PO) Take by mouth. 1400 mg (total) /Twice daily    ? SYNTHROID 125 MCG tablet Take 125 mcg by mouth daily.    ? levothyroxine (SYNTHROID) 175 MCG tablet Take 1 tablet by mouth daily. 90 tablet 3  ? ?No facility-administered medications prior to visit.  ? ? ?Allergies  ?Allergen Reactions  ? Ibuprofen Swelling  ?  Swelling/rash on face  ? Ibuprofen Rash and Swelling  ?  Swelling/rash on face  ? ? ? ?   ?Objective:  ?  ? ?Physical Exam ?Vitals and nursing note reviewed.  ?Constitutional:   ?   General:  is not in acute distress. ?   Appearance: Normal appearance.  ?HENT:  ?   Head: Normocephalic.  ?Pulmonary:  ?   Effort: No respiratory distress.  ?Skin: ?   General: Skin is dry.  ?   Coloration: Skin is not pale.  ?Neurological:  ?   Mental Status: Pt is alert  and oriented to person, place, and time.  ?Psychiatric:     ?   Mood and Affect: Mood normal.  ? ?There were no vitals taken for this visit. ? ?Wt Readings from Last 3 Encounters:  ?10/07/21 154 lb 8 oz (70.1 kg)  ?04/02/20 142 lb (64.4 kg)  ?08/29/19 154 lb (69.9 kg)  ? ? ?   ?Assessment & Plan:  ? ?Problem List Items Addressed This Visit   ? ?  ? Endocrine  ? Hypothyroidism  ? Relevant Medications  ? SYNTHROID 125 MCG tablet  ? ?Other Visit Diagnoses   ? ? Adjustment disorder with depressed mood    -  Primary  ? ?  ? Adjustment disorder w/depressed mood-improved on meds but feels could be better.  Will increase to 10mg  ?Hypothyroidism-has increased to the 175-feeling better.  Will sch labs few wks for reck. ?Mamm due-will order.  For pap-can do at  July visit or she can sch earlier if chooses ? ?No orders of the defined types were placed in this encounter. ? ? ?I discussed the assessment and treatment plan with the patient. The patient was provided an opportunity to ask questions and all were answered. The patient agreed with the plan and demonstrated an understanding of the instructions. ?  ?The patient was advised to call back or seek an in-person evaluation if the symptoms worsen or if the condition fails to improve as anticipated. ? ?I provided 15 minutes of face-to-face time during this encounter. ? ? ?Wellington Hampshire, MD ?[place] ?[phone] (phone) ?[phone] (fax) ? ?[facility]  ?

## 2021-11-16 NOTE — Patient Instructions (Addendum)
It was very nice to see you today! ? ?Glad you are doing better! ?Schedule in 2-3 weeks for lab to check on thyroid ? ?Will increase Lexapro to 10mg  ?Mammogram ordered.  ? ? ?PLEASE NOTE: ? ?If you had any lab tests please let us know if you have not heard back within a few days. You may see your results on MyChart before we have a chance to review them but we will give you a call once they are reviewed by [place]. If we ordered any referrals today, please let us know if you have not heard from their office within the next week.  ? ?Please try these tips to maintain a healthy lifestyle: ? ?Eat most of your calories during the day when you are active. Eliminate processed foods including packaged sweets (pies, cakes, cookies), reduce intake of potatoes, white bread, white pasta, and white rice. Look for whole grain options, oat flour or almond flour. ? ?Each meal should contain half fruits/vegetables, one quarter protein, and one quarter carbs (no bigger than a computer mouse). ? ?Cut down on sweet beverages. This includes juice, soda, and sweet tea. Also watch fruit intake, though this is a healthier sweet option, it still contains natural sugar! Limit to 3 servings daily. ? ?Drink at least 1 glass of water with each meal and aim for at least 8 glasses per day ? ?Exercise at least 150 minutes every week.   ?

## 2021-11-25 ENCOUNTER — Other Ambulatory Visit (INDEPENDENT_AMBULATORY_CARE_PROVIDER_SITE_OTHER): Payer: 59

## 2021-11-25 DIAGNOSIS — E063 Autoimmune thyroiditis: Secondary | ICD-10-CM

## 2021-11-25 DIAGNOSIS — E038 Other specified hypothyroidism: Secondary | ICD-10-CM

## 2021-11-25 LAB — T3, FREE: T3, Free: 3 pg/mL (ref 2.3–4.2)

## 2021-11-25 LAB — T4, FREE: Free T4: 1.15 ng/dL (ref 0.60–1.60)

## 2021-11-25 LAB — TSH: TSH: 0.09 u[IU]/mL — ABNORMAL LOW (ref 0.35–5.50)

## 2021-11-26 ENCOUNTER — Other Ambulatory Visit: Payer: Self-pay | Admitting: Family Medicine

## 2021-11-26 DIAGNOSIS — E038 Other specified hypothyroidism: Secondary | ICD-10-CM

## 2021-11-26 MED ORDER — LEVOTHYROXINE SODIUM 150 MCG PO TABS
150.0000 ug | ORAL_TABLET | Freq: Every day | ORAL | 3 refills | Status: DC
  Filled 2021-11-26: qty 90, 90d supply, fill #0
  Filled 2022-03-14: qty 90, 90d supply, fill #1
  Filled 2022-07-26: qty 90, 90d supply, fill #2

## 2021-11-28 ENCOUNTER — Other Ambulatory Visit (HOSPITAL_COMMUNITY): Payer: Self-pay

## 2021-12-29 ENCOUNTER — Ambulatory Visit: Payer: 59

## 2022-01-03 ENCOUNTER — Ambulatory Visit: Payer: 59

## 2022-01-25 ENCOUNTER — Ambulatory Visit: Payer: 59

## 2022-02-21 ENCOUNTER — Other Ambulatory Visit (HOSPITAL_COMMUNITY): Payer: Self-pay

## 2022-03-14 ENCOUNTER — Other Ambulatory Visit (HOSPITAL_COMMUNITY): Payer: Self-pay

## 2022-03-23 ENCOUNTER — Ambulatory Visit: Payer: 59

## 2022-04-07 ENCOUNTER — Ambulatory Visit: Payer: 59 | Admitting: Family Medicine

## 2022-04-11 ENCOUNTER — Other Ambulatory Visit: Payer: Self-pay | Admitting: Family Medicine

## 2022-04-11 DIAGNOSIS — Z1231 Encounter for screening mammogram for malignant neoplasm of breast: Secondary | ICD-10-CM

## 2022-04-12 ENCOUNTER — Ambulatory Visit: Payer: 59

## 2022-05-23 ENCOUNTER — Ambulatory Visit: Payer: 59

## 2022-06-12 ENCOUNTER — Encounter: Payer: Self-pay | Admitting: *Deleted

## 2022-07-26 ENCOUNTER — Other Ambulatory Visit (HOSPITAL_COMMUNITY): Payer: Self-pay

## 2022-07-27 ENCOUNTER — Other Ambulatory Visit (HOSPITAL_COMMUNITY): Payer: Self-pay

## 2022-07-28 ENCOUNTER — Other Ambulatory Visit (HOSPITAL_COMMUNITY): Payer: Self-pay

## 2022-07-31 ENCOUNTER — Other Ambulatory Visit (HOSPITAL_COMMUNITY): Payer: Self-pay

## 2022-08-01 ENCOUNTER — Other Ambulatory Visit (HOSPITAL_COMMUNITY): Payer: Self-pay

## 2022-08-23 ENCOUNTER — Ambulatory Visit: Payer: 59

## 2022-08-31 ENCOUNTER — Encounter: Payer: Self-pay | Admitting: *Deleted

## 2022-10-26 ENCOUNTER — Ambulatory Visit: Payer: 59

## 2022-11-02 ENCOUNTER — Ambulatory Visit: Payer: 59

## 2022-12-08 ENCOUNTER — Other Ambulatory Visit: Payer: Self-pay

## 2022-12-08 ENCOUNTER — Other Ambulatory Visit: Payer: Self-pay | Admitting: Family Medicine

## 2022-12-08 ENCOUNTER — Encounter (HOSPITAL_COMMUNITY): Payer: Self-pay

## 2022-12-08 ENCOUNTER — Other Ambulatory Visit (HOSPITAL_COMMUNITY): Payer: Self-pay

## 2022-12-08 MED ORDER — LEVOTHYROXINE SODIUM 150 MCG PO TABS
150.0000 ug | ORAL_TABLET | Freq: Every day | ORAL | 0 refills | Status: DC
  Filled 2022-12-08 – 2022-12-29 (×2): qty 90, 90d supply, fill #0

## 2022-12-08 NOTE — Telephone Encounter (Signed)
Needs appt

## 2022-12-11 NOTE — Telephone Encounter (Signed)
Patient will call back to sch - pt aware further refill might be delayed

## 2022-12-13 ENCOUNTER — Other Ambulatory Visit: Payer: Self-pay

## 2022-12-29 ENCOUNTER — Other Ambulatory Visit (HOSPITAL_COMMUNITY): Payer: Self-pay

## 2023-01-16 ENCOUNTER — Encounter: Payer: Self-pay | Admitting: Family Medicine

## 2023-01-16 ENCOUNTER — Ambulatory Visit (INDEPENDENT_AMBULATORY_CARE_PROVIDER_SITE_OTHER): Payer: 59 | Admitting: Family Medicine

## 2023-01-16 VITALS — BP 112/62 | HR 78 | Temp 98.3°F | Resp 18 | Ht 62.0 in | Wt 159.4 lb

## 2023-01-16 DIAGNOSIS — F5102 Adjustment insomnia: Secondary | ICD-10-CM | POA: Diagnosis not present

## 2023-01-16 DIAGNOSIS — E782 Mixed hyperlipidemia: Secondary | ICD-10-CM

## 2023-01-16 DIAGNOSIS — E663 Overweight: Secondary | ICD-10-CM

## 2023-01-16 DIAGNOSIS — E063 Autoimmune thyroiditis: Secondary | ICD-10-CM | POA: Diagnosis not present

## 2023-01-16 DIAGNOSIS — E038 Other specified hypothyroidism: Secondary | ICD-10-CM | POA: Diagnosis not present

## 2023-01-16 DIAGNOSIS — Z6829 Body mass index (BMI) 29.0-29.9, adult: Secondary | ICD-10-CM | POA: Diagnosis not present

## 2023-01-16 NOTE — Progress Notes (Signed)
Subjective:     Patient ID: Abigail Goodwin, female    DOB: 29-Mar-1971, 52 y.o.   MRN: [id number]  Chief Complaint  Patient presents with   Medical Management of Chronic Issues    Follow-up on thyroid     HPI  Hypothyroidism-Hashimoto's.  Taking 150 daily. HLD-working on diet/exercise  patient vegetarian. Weight-had gained to 170.  Was eating a lot w/Lexapro so gained-stopped and working on diet-has lost 10#.   Sleep-wakes up at 3am-toss and turn till 5am and back to sleep.  Some days, tired.   Health Maintenance Due  Topic Date Due   MAMMOGRAM  Never done   COLONOSCOPY (Pts 45-62yrs Insurance coverage will need to be confirmed)  Never done   PAP SMEAR-Modifier  12/09/2019    Past Medical History:  Diagnosis Date   Hepatitis at age 67    Hypothyroidism    Miscarriage     Past Surgical History:  Procedure Laterality Date   CESAREAN SECTION     DILATION AND EVACUATION  06/09/2011   Procedure: DILATATION AND EVACUATION (D&E) 2ND TRIMESTER;  Surgeon: Jeani Hawking, MD;  Location: [facility];  Service: Gynecology;  Laterality: N/A;  14 weeks/ultrasound and chromosome studies   WISDOM TOOTH EXTRACTION       Current Outpatient Medications:    B Complex Vitamins (B COMPLEX 1 PO), Take by mouth., Disp: , Rfl:    Cholecalciferol (VITAMIN D) 50 MCG (2000 UT) CAPS, Take by mouth., Disp: , Rfl:    clindamycin (CLEOCIN-T) 1 % external solution, Apply topically 2 (two) times daily., Disp: 60 mL, Rfl: 3   ferrous sulfate 325 (65 FE) MG tablet, Take 325 mg by mouth daily with breakfast., Disp: , Rfl:    levothyroxine (SYNTHROID) 150 MCG tablet, Take 1 tablet (150 mcg total) by mouth daily., Disp: 90 tablet, Rfl: 0   Omega-3 Fatty Acids (FISH OIL PO), Take by mouth. 1400 mg (total) /Twice daily, Disp: , Rfl:   Allergies  Allergen Reactions   Ibuprofen Swelling    Swelling/rash on face   Ibuprofen Rash and Swelling    Swelling/rash on face   ROS neg/noncontributory except as noted  HPI/below      Objective:     BP 112/62   Pulse 78   Temp 98.3 F (36.8 C) (Temporal)   Resp 18   Ht 5\' 2"  (1.575 m)   Wt 159 lb 6 oz (72.3 kg)   LMP  (Approximate)   SpO2 98%   BMI 29.15 kg/m  Wt Readings from Last 3 Encounters:  01/16/23 159 lb 6 oz (72.3 kg)  10/07/21 154 lb 8 oz (70.1 kg)  04/02/20 142 lb (64.4 kg)    Physical Exam   Gen: WDWN NAD HEENT: NCAT, conjunctiva not injected, sclera nonicteric NECK:  supple, + thyromegaly, no nodes, no carotid bruits CARDIAC: RRR, S1S2+, no murmur. DP 2+B LUNGS: CTAB. No wheezes ABDOMEN:  BS+, soft, NTND, No HSM, no masses EXT:  no edema MSK: no gross abnormalities.  NEURO: A&O x3.  CN II-XII intact.  PSYCH: normal mood. Good eye contact     Assessment & Plan:  Hypothyroidism due to Hashimoto's thyroiditis  Mixed hyperlipidemia  Adjustment insomnia  Overweight with body mass index (BMI) of 29 to 29.9 in adult  1.  Hashimoto's thyroiditis-chronic.  Controlled on 150 mcg of Synthroid daily.  She will return for labs 2.  Hyperlipidemia-chronic.  Not controlled.  Patient is working harder on diet.  Advised to incorporate exercise  30 minutes/day.  Return for annual physical and labs 3.  Insomnia-wakes up about 3 AM and can take 2 hours to fall back asleep.  Suspect more due to perimenopause.  Advised to increase exercise, sleep hygiene. 4.  Overweight-patient has Hashimoto's.  Not sure if thyroid needs to be adjusted at all.  Also, had gained weight when on Lexapro.  Working on diet.  Advised to exercise 30 minutes/day.  Continue to monitor.  Follow-up annual physical/labs  Angelena Sole, MD

## 2023-01-16 NOTE — Patient Instructions (Addendum)
It was very nice to see you today!  B complex  Calcium 600mg  twice daily   vitamin D at least 1000 Omega 3-200 mg daily.   PLEASE NOTE:  If you had any lab tests please let us know if you have not heard back within a few days. You may see your results on MyChart before we have a chance to review them but we will give you a call once they are reviewed by [place]. If we ordered any referrals today, please let us know if you have not heard from their office within the next week.   Please try these tips to maintain a healthy lifestyle:  Eat most of your calories during the day when you are active. Eliminate processed foods including packaged sweets (pies, cakes, cookies), reduce intake of potatoes, white bread, white pasta, and white rice. Look for whole grain options, oat flour or almond flour.  Each meal should contain half fruits/vegetables, one quarter protein, and one quarter carbs (no bigger than a computer mouse).  Cut down on sweet beverages. This includes juice, soda, and sweet tea. Also watch fruit intake, though this is a healthier sweet option, it still contains natural sugar! Limit to 3 servings daily.  Drink at least 1 glass of water with each meal and aim for at least 8 glasses per day  Exercise at least 150 minutes every week.

## 2023-01-31 ENCOUNTER — Encounter: Payer: Self-pay | Admitting: Family Medicine

## 2023-01-31 ENCOUNTER — Ambulatory Visit (INDEPENDENT_AMBULATORY_CARE_PROVIDER_SITE_OTHER): Payer: 59 | Admitting: Family Medicine

## 2023-01-31 ENCOUNTER — Other Ambulatory Visit (HOSPITAL_BASED_OUTPATIENT_CLINIC_OR_DEPARTMENT_OTHER): Payer: Self-pay

## 2023-01-31 VITALS — BP 102/70 | HR 69 | Temp 98.3°F | Resp 16 | Ht 62.0 in | Wt 162.2 lb

## 2023-01-31 DIAGNOSIS — N926 Irregular menstruation, unspecified: Secondary | ICD-10-CM | POA: Diagnosis not present

## 2023-01-31 DIAGNOSIS — Z789 Other specified health status: Secondary | ICD-10-CM

## 2023-01-31 DIAGNOSIS — Z7984 Long term (current) use of oral hypoglycemic drugs: Secondary | ICD-10-CM

## 2023-01-31 DIAGNOSIS — Z0000 Encounter for general adult medical examination without abnormal findings: Secondary | ICD-10-CM

## 2023-01-31 DIAGNOSIS — E559 Vitamin D deficiency, unspecified: Secondary | ICD-10-CM | POA: Diagnosis not present

## 2023-01-31 DIAGNOSIS — Z1211 Encounter for screening for malignant neoplasm of colon: Secondary | ICD-10-CM

## 2023-01-31 DIAGNOSIS — Z124 Encounter for screening for malignant neoplasm of cervix: Secondary | ICD-10-CM

## 2023-01-31 DIAGNOSIS — Z01419 Encounter for gynecological examination (general) (routine) without abnormal findings: Secondary | ICD-10-CM

## 2023-01-31 LAB — COMPREHENSIVE METABOLIC PANEL
ALT: 11 U/L (ref 0–35)
AST: 13 U/L (ref 0–37)
Albumin: 3.8 g/dL (ref 3.5–5.2)
Alkaline Phosphatase: 78 U/L (ref 39–117)
BUN: 11 mg/dL (ref 6–23)
CO2: 26 mEq/L (ref 19–32)
Calcium: 8.5 mg/dL (ref 8.4–10.5)
Chloride: 104 mEq/L (ref 96–112)
Creatinine, Ser: 0.68 mg/dL (ref 0.40–1.20)
GFR: 101.76 mL/min (ref >60.00)
Glucose, Bld: 80 mg/dL (ref 70–99)
Potassium: 4.4 mEq/L (ref 3.5–5.1)
Sodium: 137 mEq/L (ref 135–145)
Total Bilirubin: 0.3 mg/dL (ref 0.2–1.2)
Total Protein: 6.8 g/dL (ref 6.0–8.3)

## 2023-01-31 LAB — POC URINALSYSI DIPSTICK (AUTOMATED)
Bilirubin, UA: NEGATIVE
Blood, UA: NEGATIVE
Glucose, UA: NEGATIVE
Ketones, UA: NEGATIVE
Leukocytes, UA: NEGATIVE
Nitrite, UA: NEGATIVE
Protein, UA: NEGATIVE
Spec Grav, UA: >=1.03 — AB (ref 1.010–1.025)
Urobilinogen, UA: 0.2 E.U./dL
pH, UA: 5.5 (ref 5.0–8.0)

## 2023-01-31 LAB — CBC WITH DIFFERENTIAL/PLATELET
Basophils Absolute: 0.1 10*3/uL (ref 0.0–0.1)
Basophils Relative: 0.6 % (ref 0.0–3.0)
Eosinophils Absolute: 0.2 10*3/uL (ref 0.0–0.7)
Eosinophils Relative: 2.1 % (ref 0.0–5.0)
HCT: 36.9 % (ref 36.0–46.0)
Hemoglobin: 12 g/dL (ref 12.0–15.0)
Lymphocytes Relative: 28.6 % (ref 12.0–46.0)
Lymphs Abs: 2.5 10*3/uL (ref 0.7–4.0)
MCHC: 32.6 g/dL (ref 30.0–36.0)
MCV: 78.5 fl (ref 78.0–100.0)
Monocytes Absolute: 0.4 10*3/uL (ref 0.1–1.0)
Monocytes Relative: 4.5 % (ref 3.0–12.0)
Neutro Abs: 5.7 10*3/uL (ref 1.4–7.7)
Neutrophils Relative %: 64.2 % (ref 43.0–77.0)
Platelets: 439 10*3/uL — ABNORMAL HIGH (ref 150.0–400.0)
RBC: 4.71 Mil/uL (ref 3.87–5.11)
RDW: 14.4 % (ref 11.5–15.5)
WBC: 8.9 10*3/uL (ref 4.0–10.5)

## 2023-01-31 LAB — LIPID PANEL
Cholesterol: 211 mg/dL — ABNORMAL HIGH (ref 0–200)
HDL: 40.7 mg/dL (ref >39.00)
LDL Cholesterol: 150 mg/dL — ABNORMAL HIGH (ref 0–99)
NonHDL: 169.91
Total CHOL/HDL Ratio: 5
Triglycerides: 100 mg/dL (ref 0.0–149.0)
VLDL: 20 mg/dL (ref 0.0–40.0)

## 2023-01-31 LAB — TSH: TSH: 1.26 u[IU]/mL (ref 0.35–5.50)

## 2023-01-31 LAB — VITAMIN D 25 HYDROXY (VIT D DEFICIENCY, FRACTURES): VITD: 26.95 ng/mL — ABNORMAL LOW (ref 30.00–100.00)

## 2023-01-31 LAB — POCT URINE PREGNANCY: Preg Test, Ur: NEGATIVE

## 2023-01-31 LAB — HEMOGLOBIN A1C: Hgb A1c MFr Bld: 5.9 % (ref 4.6–6.5)

## 2023-01-31 LAB — VITAMIN B12: Vitamin B-12: 374 pg/mL (ref 211–911)

## 2023-01-31 MED ORDER — CLINDAMYCIN PHOSPHATE 1 % EX SOLN
Freq: Two times a day (BID) | CUTANEOUS | 3 refills | Status: DC
  Filled 2023-01-31: qty 60, 30d supply, fill #0

## 2023-01-31 NOTE — Patient Instructions (Signed)
It was very nice to see you today!  Work on exercise     PLEASE NOTE:  If you had any lab tests please let us know if you have not heard back within a few days. You may see your results on MyChart before we have a chance to review them but we will give you a call once they are reviewed by us. If we ordered any referrals today, please let us know if you have not heard from their office within the next week.   Please try these tips to maintain a healthy lifestyle:  Eat most of your calories during the day when you are active. Eliminate processed foods including packaged sweets (pies, cakes, cookies), reduce intake of potatoes, white bread, white pasta, and white rice. Look for whole grain options, oat flour or almond flour.  Each meal should contain half fruits/vegetables, one quarter protein, and one quarter carbs (no bigger than a computer mouse).  Cut down on sweet beverages. This includes juice, soda, and sweet tea. Also watch fruit intake, though this is a healthier sweet option, it still contains natural sugar! Limit to 3 servings daily.  Drink at least 1 glass of water with each meal and aim for at least 8 glasses per day  Exercise at least 150 minutes every week.   

## 2023-01-31 NOTE — Progress Notes (Signed)
Phone [phone]   Subjective:   Patient is a 52 y.o. female presenting for annual physical.    Chief Complaint  Patient presents with   Annual Exam    CPE Fasting Declined colonoscopy would like to do cologuard instead   Gynecologic Exam  1  annual w/pap Menses becoming irreg.  No menses almost 2 month(s).  Ok if pregnant  See problem oriented charting- ROS- ROS: Gen: no fever, chills  Skin: no rash, itching ENT: no ear pain, ear drainage, nasal congestion, rhinorrhea, sinus pressure, sore throat Eyes: no blurry vision, double vision Resp: no cough, wheeze,SOB CV: no CP, palpitations, LE edema,  GI: no heartburn, n/v/d/c, abd pain GU: no dysuria, urgency, frequency, hematuria MSK: no joint pain, myalgias, back pain Neuro: no dizziness, headache, weakness, vertigo Psych: no depression, anxiety, insomnia, SI   The following were reviewed and entered/updated in epic: Past Medical History:  Diagnosis Date   Hepatitis at age 52    Hypothyroidism    Miscarriage    Patient Active Problem List   Diagnosis Date Noted   Vegetarian diet 10/07/2021   Hyperlipidemia 09/01/2019   Vitamin D deficiency 05/10/2017   Hypothyroidism 11/28/2012   Past Surgical History:  Procedure Laterality Date   CESAREAN SECTION     DILATION AND EVACUATION  06/09/2011   Procedure: DILATATION AND EVACUATION (D&E) 2ND TRIMESTER;  Surgeon: Jeani Hawking, MD;  Location: [facility];  Service: Gynecology;  Laterality: N/A;  14 weeks/ultrasound and chromosome studies   WISDOM TOOTH EXTRACTION      Family History  Problem Relation Age of Onset   Thyroid disease Mother    Dysphagia Mother    Hypertension Father    Diabetes Father     Medications- reviewed and updated Current Outpatient Medications  Medication Sig Dispense Refill   levothyroxine (SYNTHROID) 150 MCG tablet Take 1 tablet (150 mcg total) by mouth daily. 90 tablet 0   Omega-3 Fatty Acids (FISH OIL PO) Take by mouth. 1400 mg  (total) /Twice daily     B Complex Vitamins (B COMPLEX 1 PO) Take by mouth. (Patient not taking: Reported on 01/31/2023)     Cholecalciferol (VITAMIN D) 50 MCG (2000 UT) CAPS Take by mouth. (Patient not taking: Reported on 01/31/2023)     clindamycin (CLEOCIN-T) 1 % external solution Apply topically 2 (two) times daily. 60 mL 3   ferrous sulfate 325 (65 FE) MG tablet Take 325 mg by mouth daily with breakfast. (Patient not taking: Reported on 01/31/2023)     No current facility-administered medications for this visit.    Allergies-reviewed and updated Allergies  Allergen Reactions   Ibuprofen Swelling    Swelling/rash on face   Ibuprofen Rash and Swelling    Swelling/rash on face    Social History   Social History Narrative   Not on file   Objective  Objective:  BP 102/70   Pulse 69   Temp 98.3 F (36.8 C) (Temporal)   Resp 16   Ht 5\' 2"  (1.575 m)   Wt 162 lb 4 oz (73.6 kg)   LMP  (Approximate) Comment: last menstrual in March  SpO2 97%   BMI 29.68 kg/m  Physical Exam  Gen: WDWN NAD HEENT: NCAT, conjunctiva not injected, sclera nonicteric TM WNL B, OP moist, no exudates  NECK:  supple, no thyromegaly, no nodes, no carotid bruits CARDIAC: RRR, S1S2+, no murmur. DP 2+B LUNGS: CTAB. No wheezes ABDOMEN:  BS+, soft, NTND, No HSM, no masses EXT:  no  edema MSK: no gross abnormalities. MS 5/5 all 4 NEURO: A&O x3.  CN II-XII intact.  PSYCH: normal mood. Good eye contact  Breasts: Examined lying .              Right:   Without masses, retractions,  nipple discharge or axillary adenopathy.               Left:     Without masses, retractions, nipple discharge or axillary adenopathy. Genitourinary              Inguinal/mons:  Normal without inguinal adenopathy             External genitalia:  Normal appearing vulva with no masses, tenderness, or lesions             BUS/Urethra/Skene's glands:  Normal             Vagina:  Normal appearing with normal color and discharge, no  lesions             Cervix:  could not visualize after several attempts and repositioning             Uterus:  tilted to left and firm             Adnexa/parametria:                           Rt:        Normal in size, without masses or tenderness.                         Lt:        Normal in size, without masses or tenderness.             Anus and perineum: Normal  Chaperone present QJ     Assessment and Plan   Health Maintenance counseling: 1. Anticipatory guidance: Patient counseled regarding regular dental exams q6 months, eye exams,  avoiding smoking and second hand smoke, limiting alcohol to 1 beverage per day, no illicit drugs.   2. Risk factor reduction:  Advised patient of need for regular exercise and diet rich and fruits and vegetables to reduce risk of heart attack and stroke. Exercise- encouraged.  Wt Readings from Last 3 Encounters:  01/31/23 162 lb 4 oz (73.6 kg)  01/16/23 159 lb 6 oz (72.3 kg)  10/07/21 154 lb 8 oz (70.1 kg)   3. Immunizations/screenings/ancillary studies Immunization History  Administered Date(s) Administered   H1N1 07/30/2008   Influenza,inj,Quad PF,6+ Mos 05/10/2017, 07/11/2019   MMR 05/03/2016   PFIZER(Purple Top)SARS-COV-2 Vaccination 12/13/2019, 01/03/2020, 09/14/2020   Tdap 04/25/2016   Health Maintenance Due  Topic Date Due   MAMMOGRAM  Never done   COLONOSCOPY (Pts 45-62yrs Insurance coverage will need to be confirmed)  Never done   PAP SMEAR-Modifier  12/09/2019    4. Cervical cancer screening- ordered 5. Breast cancer screening-  mammogram sch 6. Colon cancer screening - ordered 7. Skin cancer screening- advised regular sunscreen use. Denies worrisome, changing, or new skin lesions.  8. Birth control/STD check- none 9. Osteoporosis screening- n/a 10. Smoking associated screening - non smoker  Wellness examination -     Hemoglobin A1c -     CBC with Differential/Platelet -     Comprehensive metabolic panel -     Lipid  panel -     TSH -     Vitamin B12 -  VITAMIN D 25 Hydroxy (Vit-D Deficiency, Fractures)  Well woman exam with routine gynecological exam -     Cytology - PAP -     POCT Urinalysis Dipstick (Automated)  Screening for cervical cancer -     Cytology - PAP  Screening for colorectal cancer -     Cologuard  Vegetarian diet -     Vitamin B12  Vitamin D deficiency -     VITAMIN D 25 Hydroxy (Vit-D Deficiency, Fractures)  Missed menses -     POCT urine pregnancy  Other orders -     Clindamycin Phosphate; Apply topically 2 (two) times daily.  Dispense: 60 mL; Refill: 3   Wellness-anticipatory guidance.  Work on Diet/Exercise  Check CBC,CMP,lipids,TSH, A1C.  F/u 1 yr  Wellwoman-return pap.  Mamm schedule.    Recommended follow up: No follow-ups on file.  Lab/Order associations:+ fasting  Angelena Sole, MD

## 2023-01-31 NOTE — Progress Notes (Signed)
1.  Urine-looks a little bit dehydrated.  Make sure you are drinking plenty of water 2.  Vitamin D is low.  Start back on 2000 IUs/day 3.  Vitamin B12 is pretty good.  Recommend taking B12 supplements at least a couple of times a week. 4.  Platelets are little bit elevated-usually this is due to a mild iron deficiency.  Get back on the iron supplements every other day. 5.Your cholesterol levels are elevated.  Work on low cholesterol and lower carbs/sugars diet and  get exercise to try to lower your cholesterol.  It has been high for many years.  Consider medication 6.  Thyroid is at the right dose. 7..A1C(3 month average of sugars) is elevated.  This is considered PreDiabetes.  Work on diet-decrease sugars and starches and aim for 30 minutes of exercise 5 days/week to prevent progression to diabetes   Schedule appointment in 3 to 4 months for follow-up on all these lab abnormalities

## 2023-02-08 ENCOUNTER — Other Ambulatory Visit (HOSPITAL_BASED_OUTPATIENT_CLINIC_OR_DEPARTMENT_OTHER): Payer: Self-pay

## 2023-02-16 ENCOUNTER — Ambulatory Visit: Payer: 59

## 2023-03-13 ENCOUNTER — Ambulatory Visit: Payer: 59 | Admitting: Family Medicine

## 2023-04-12 ENCOUNTER — Ambulatory Visit: Payer: 59

## 2023-04-17 ENCOUNTER — Other Ambulatory Visit (HOSPITAL_COMMUNITY): Payer: Self-pay

## 2023-05-07 ENCOUNTER — Other Ambulatory Visit: Payer: Self-pay | Admitting: Family Medicine

## 2023-05-07 ENCOUNTER — Other Ambulatory Visit (HOSPITAL_COMMUNITY): Payer: Self-pay

## 2023-05-07 MED ORDER — LEVOTHYROXINE SODIUM 150 MCG PO TABS
150.0000 ug | ORAL_TABLET | Freq: Every day | ORAL | 3 refills | Status: DC
  Filled 2023-05-07 – 2023-05-14 (×2): qty 90, 90d supply, fill #0

## 2023-05-08 ENCOUNTER — Encounter: Payer: Self-pay | Admitting: Pharmacist

## 2023-05-08 ENCOUNTER — Other Ambulatory Visit: Payer: Self-pay

## 2023-05-14 ENCOUNTER — Other Ambulatory Visit (HOSPITAL_COMMUNITY): Payer: Self-pay

## 2023-05-14 ENCOUNTER — Other Ambulatory Visit: Payer: Self-pay

## 2023-05-16 ENCOUNTER — Other Ambulatory Visit: Payer: Self-pay

## 2023-06-18 ENCOUNTER — Telehealth: Payer: Self-pay | Admitting: Family Medicine

## 2023-06-18 NOTE — Telephone Encounter (Signed)
Patient requests to have the following labs done prior to appointment on 07/24/23:  Lipid Profile, A1C, Iron and Vitamin D   Please advise

## 2023-06-18 NOTE — Telephone Encounter (Signed)
Please see message below and advise.

## 2023-06-19 ENCOUNTER — Other Ambulatory Visit: Payer: Self-pay | Admitting: Family Medicine

## 2023-06-19 ENCOUNTER — Ambulatory Visit: Payer: 59 | Admitting: Family Medicine

## 2023-06-19 DIAGNOSIS — Z789 Other specified health status: Secondary | ICD-10-CM

## 2023-06-19 DIAGNOSIS — E559 Vitamin D deficiency, unspecified: Secondary | ICD-10-CM

## 2023-06-19 DIAGNOSIS — E063 Autoimmune thyroiditis: Secondary | ICD-10-CM | POA: Insufficient documentation

## 2023-06-19 DIAGNOSIS — R7303 Prediabetes: Secondary | ICD-10-CM

## 2023-06-19 DIAGNOSIS — E7800 Pure hypercholesterolemia, unspecified: Secondary | ICD-10-CM

## 2023-06-20 NOTE — Telephone Encounter (Signed)
Labs ordered. Patient can schedule lab visit. Thanks

## 2023-06-26 ENCOUNTER — Other Ambulatory Visit: Payer: 59

## 2023-07-09 ENCOUNTER — Other Ambulatory Visit (INDEPENDENT_AMBULATORY_CARE_PROVIDER_SITE_OTHER): Payer: 59

## 2023-07-09 DIAGNOSIS — E063 Autoimmune thyroiditis: Secondary | ICD-10-CM | POA: Diagnosis not present

## 2023-07-09 DIAGNOSIS — Z789 Other specified health status: Secondary | ICD-10-CM

## 2023-07-09 DIAGNOSIS — E559 Vitamin D deficiency, unspecified: Secondary | ICD-10-CM | POA: Diagnosis not present

## 2023-07-09 DIAGNOSIS — E7800 Pure hypercholesterolemia, unspecified: Secondary | ICD-10-CM

## 2023-07-09 DIAGNOSIS — R7303 Prediabetes: Secondary | ICD-10-CM | POA: Diagnosis not present

## 2023-07-09 LAB — HEMOGLOBIN A1C: Hgb A1c MFr Bld: 5.7 % (ref 4.6–6.5)

## 2023-07-09 LAB — COMPREHENSIVE METABOLIC PANEL
ALT: 20 U/L (ref 0–35)
AST: 16 U/L (ref 0–37)
Albumin: 4 g/dL (ref 3.5–5.2)
Alkaline Phosphatase: 80 U/L (ref 39–117)
BUN: 13 mg/dL (ref 6–23)
CO2: 26 meq/L (ref 19–32)
Calcium: 8.7 mg/dL (ref 8.4–10.5)
Chloride: 101 meq/L (ref 96–112)
Creatinine, Ser: 0.73 mg/dL (ref 0.40–1.20)
GFR: 95.8 mL/min (ref >60.00)
Glucose, Bld: 88 mg/dL (ref 70–99)
Potassium: 4 meq/L (ref 3.5–5.1)
Sodium: 137 meq/L (ref 135–145)
Total Bilirubin: 0.3 mg/dL (ref 0.2–1.2)
Total Protein: 6.9 g/dL (ref 6.0–8.3)

## 2023-07-09 LAB — CBC WITH DIFFERENTIAL/PLATELET
Basophils Absolute: 0.1 10*3/uL (ref 0.0–0.1)
Basophils Relative: 0.6 % (ref 0.0–3.0)
Eosinophils Absolute: 0.2 10*3/uL (ref 0.0–0.7)
Eosinophils Relative: 2.4 % (ref 0.0–5.0)
HCT: 36.1 % (ref 36.0–46.0)
Hemoglobin: 11.5 g/dL — ABNORMAL LOW (ref 12.0–15.0)
Lymphocytes Relative: 31.6 % (ref 12.0–46.0)
Lymphs Abs: 2.7 10*3/uL (ref 0.7–4.0)
MCHC: 31.8 g/dL (ref 30.0–36.0)
MCV: 73.6 fL — ABNORMAL LOW (ref 78.0–100.0)
Monocytes Absolute: 0.5 10*3/uL (ref 0.1–1.0)
Monocytes Relative: 5.2 % (ref 3.0–12.0)
Neutro Abs: 5.2 10*3/uL (ref 1.4–7.7)
Neutrophils Relative %: 60.2 % (ref 43.0–77.0)
Platelets: 416 10*3/uL — ABNORMAL HIGH (ref 150.0–400.0)
RBC: 4.91 Mil/uL (ref 3.87–5.11)
RDW: 16.6 % — ABNORMAL HIGH (ref 11.5–15.5)
WBC: 8.7 10*3/uL (ref 4.0–10.5)

## 2023-07-09 LAB — LIPID PANEL
Cholesterol: 223 mg/dL — ABNORMAL HIGH (ref 0–200)
HDL: 44.9 mg/dL (ref >39.00)
LDL Cholesterol: 145 mg/dL — ABNORMAL HIGH (ref 0–99)
NonHDL: 178.07
Total CHOL/HDL Ratio: 5
Triglycerides: 164 mg/dL — ABNORMAL HIGH (ref 0.0–149.0)
VLDL: 32.8 mg/dL (ref 0.0–40.0)

## 2023-07-09 LAB — IBC + FERRITIN
Ferritin: 5.4 ng/mL — ABNORMAL LOW (ref 10.0–291.0)
Iron: 43 ug/dL (ref 42–145)
Saturation Ratios: 11.4 % — ABNORMAL LOW (ref 20.0–50.0)
TIBC: 376.6 ug/dL (ref 250.0–450.0)
Transferrin: 269 mg/dL (ref 212.0–360.0)

## 2023-07-09 LAB — VITAMIN D 25 HYDROXY (VIT D DEFICIENCY, FRACTURES): VITD: 23.32 ng/mL — ABNORMAL LOW (ref 30.00–100.00)

## 2023-07-09 LAB — VITAMIN B12: Vitamin B-12: 513 pg/mL (ref 211–911)

## 2023-07-09 LAB — TSH: TSH: 14.03 u[IU]/mL — ABNORMAL HIGH (ref 0.35–5.50)

## 2023-07-11 NOTE — Progress Notes (Signed)
Has an appointment on 11 5 so we will discuss the labs in more detail then.  Of concern, thyroid dose-has she missed medications?  If so, needs to take regularly.  If not missed any, we will make adjustments at appointment. Iron levels are getting a bit low again.  If she has not been taking it, needs to get back on it

## 2023-07-16 ENCOUNTER — Encounter: Payer: Self-pay | Admitting: Family Medicine

## 2023-07-16 ENCOUNTER — Ambulatory Visit: Payer: 59 | Admitting: Family Medicine

## 2023-07-16 ENCOUNTER — Other Ambulatory Visit (HOSPITAL_BASED_OUTPATIENT_CLINIC_OR_DEPARTMENT_OTHER): Payer: Self-pay

## 2023-07-16 ENCOUNTER — Other Ambulatory Visit: Payer: Self-pay

## 2023-07-16 VITALS — BP 114/75 | HR 58 | Temp 97.9°F | Resp 16 | Ht 62.0 in | Wt 160.5 lb

## 2023-07-16 DIAGNOSIS — E782 Mixed hyperlipidemia: Secondary | ICD-10-CM | POA: Diagnosis not present

## 2023-07-16 DIAGNOSIS — D500 Iron deficiency anemia secondary to blood loss (chronic): Secondary | ICD-10-CM | POA: Diagnosis not present

## 2023-07-16 DIAGNOSIS — Z23 Encounter for immunization: Secondary | ICD-10-CM | POA: Diagnosis not present

## 2023-07-16 DIAGNOSIS — E063 Autoimmune thyroiditis: Secondary | ICD-10-CM

## 2023-07-16 DIAGNOSIS — E559 Vitamin D deficiency, unspecified: Secondary | ICD-10-CM | POA: Diagnosis not present

## 2023-07-16 DIAGNOSIS — E038 Other specified hypothyroidism: Secondary | ICD-10-CM | POA: Diagnosis not present

## 2023-07-16 MED ORDER — VITAMIN D (ERGOCALCIFEROL) 1.25 MG (50000 UNIT) PO CAPS
50000.0000 [IU] | ORAL_CAPSULE | ORAL | 0 refills | Status: AC
  Filled 2023-07-16: qty 12, 84d supply, fill #0
  Filled 2024-01-25: qty 12, 84d supply, fill #1

## 2023-07-16 MED ORDER — CLINDAMYCIN PHOSPHATE 1 % EX SOLN
Freq: Two times a day (BID) | CUTANEOUS | 3 refills | Status: AC
  Filled 2023-07-16: qty 60, 30d supply, fill #0
  Filled 2023-10-15: qty 60, 30d supply, fill #1

## 2023-07-16 MED ORDER — LEVOTHYROXINE SODIUM 175 MCG PO TABS
175.0000 ug | ORAL_TABLET | Freq: Every day | ORAL | 0 refills | Status: DC
  Filled 2023-07-16 (×2): qty 42, 42d supply, fill #0

## 2023-07-16 NOTE — Patient Instructions (Signed)
1 wk prior to labs, don't take B vitamins and multi.

## 2023-07-16 NOTE — Assessment & Plan Note (Signed)
Mixed.  Chronic.  Not working on diet/exercise as much.  Advised to restart

## 2023-07-16 NOTE — Progress Notes (Signed)
Subjective:     Patient ID: Abigail Goodwin, female    DOB: [birth year], 52 y.o.   MRN: [id number]  Chief Complaint  Patient presents with   Medical Management of Chronic Issues    4 month follow-up on thyroid, possible dose increase and referral to endo Not fasting Mammo and pap scheduled with gyn    HPI  Hypothyroidism-Hashimoto's.  Taking synthroid 150 daily. Taking meds correctly.  More tired.  Occ misses meds. Had covid 2 months ago. Struggles to lose wt. No menses for 3 months HLD-working on diet/exercise  patient vegetarian Vitamin D deficiency-not taking supps. Anemia-hasn't done cologard.yet.  no menses in 3 months but heavy when gets. Just got vitamin w/iron Sees gyn-Dr. Vincente Poli in Jan  Health Maintenance Due  Topic Date Due   MAMMOGRAM  Never done   Cervical Cancer Screening (HPV/Pap Cotest)  12/08/2017   Colonoscopy  Never done    Past Medical History:  Diagnosis Date   Hepatitis at age 34    Hypothyroidism    Miscarriage     Past Surgical History:  Procedure Laterality Date   CESAREAN SECTION     DILATION AND EVACUATION  06/09/2011   Procedure: DILATATION AND EVACUATION (D&E) 2ND TRIMESTER;  Surgeon: Jeani Hawking, MD;  Location: [facility];  Service: Gynecology;  Laterality: N/A;  14 weeks/ultrasound and chromosome studies   WISDOM TOOTH EXTRACTION       Current Outpatient Medications:    levothyroxine (SYNTHROID) 175 MCG tablet, Take 1 tablet (175 mcg total) by mouth daily., Disp: 90 tablet, Rfl: 0   Omega-3 Fatty Acids (FISH OIL PO), Take by mouth. 1400 mg (total) /Twice daily, Disp: , Rfl:    Vitamin D, Ergocalciferol, (DRISDOL) 1.25 MG (50000 UNIT) CAPS capsule, Take 1 capsule (50,000 Units total) by mouth every 7 (seven) days., Disp: 13 capsule, Rfl: 0   B Complex Vitamins (B COMPLEX 1 PO), Take by mouth. (Patient not taking: Reported on 01/31/2023), Disp: , Rfl:    Cholecalciferol (VITAMIN D) 50 MCG (2000 UT) CAPS, Take by mouth. (Patient not taking:  Reported on 01/31/2023), Disp: , Rfl:    clindamycin (CLEOCIN T) 1 % external solution, Apply topically 2 (two) times daily., Disp: 60 mL, Rfl: 3   ferrous sulfate 325 (65 FE) MG tablet, Take 325 mg by mouth daily with breakfast. (Patient not taking: Reported on 01/31/2023), Disp: , Rfl:   Allergies  Allergen Reactions   Ibuprofen Swelling    Swelling/rash on face   Ibuprofen Rash and Swelling    Swelling/rash on face   ROS neg/noncontributory except as noted HPI/below      Objective:     BP 114/75   Pulse (!) 58   Temp 97.9 F (36.6 C) (Temporal)   Resp 16   Ht 5\' 2"  (1.575 m)   Wt 160 lb 8 oz (72.8 kg)   SpO2 99%   BMI 29.36 kg/m  Wt Readings from Last 3 Encounters:  07/16/23 160 lb 8 oz (72.8 kg)  01/31/23 162 lb 4 oz (73.6 kg)  01/16/23 159 lb 6 oz (72.3 kg)    Physical Exam   Gen: WDWN NAD HEENT: NCAT, conjunctiva not injected, sclera nonicteric NECK:  supple, +mild thyromegaly CARDIAC: RRR, S1S2+, no murmur. EXT:  no edema MSK: no gross abnormalities.  NEURO: A&O x3.  CN II-XII intact.  PSYCH: normal mood. Good eye contact  Discussed labs    Assessment & Plan:  Other specified hypothyroidism -  Levothyroxine Sodium; Take 1 tablet (175 mcg total) by mouth daily.  Dispense: 90 tablet; Refill: 0 -     Ambulatory referral to Endocrinology -     TSH; Future  Mixed hyperlipidemia Assessment & Plan: Mixed.  Chronic.  Not working on diet/exercise as much.  Advised to restart   Vitamin D deficiency Assessment & Plan: Chronic.  Pt stopped supps.  Will do 50k weekly for 13 wks, then go back to 2000 iu/day   Iron deficiency anemia due to chronic blood loss Assessment & Plan: Chronic.  Not sure if heavy menses, dietary deficiency, GI.  Do the cologard.  Sees gyn in Jan.  Take the iron!  Orders: -     CBC with Differential/Platelet; Future  Need for influenza vaccination -     Flu vaccine trivalent PF, 6mos and  older(Flulaval,Afluria,Fluarix,Fluzone)  Hypothyroidism due to Hashimoto's thyroiditis Assessment & Plan: Chronic.  Not controlled on synthroid 0.150mg .  pt taking correctly.  Increase to 175.  Hold B supplements 1 wk prior to labs  pt requesting referral to endo.     Other orders -     Clindamycin Phosphate; Apply topically 2 (two) times daily.  Dispense: 60 mL; Refill: 3 -     Vitamin D (Ergocalciferol); Take 1 capsule (50,000 Units total) by mouth every 7 (seven) days.  Dispense: 13 capsule; Refill: 0    Return in about 6 months (around 01/14/2024) for 6 mo me f/u.   labs 6 wks.  Angelena Sole, MD

## 2023-07-16 NOTE — Assessment & Plan Note (Signed)
Chronic.  Not sure if heavy menses, dietary deficiency, GI.  Do the cologard.  Sees gyn in Jan.  Take the iron!

## 2023-07-16 NOTE — Assessment & Plan Note (Signed)
Chronic.  Pt stopped supps.  Will do 50k weekly for 13 wks, then go back to 2000 iu/day

## 2023-07-16 NOTE — Assessment & Plan Note (Signed)
Chronic.  Not controlled on synthroid 0.150mg .  pt taking correctly.  Increase to 175.  Hold B supplements 1 wk prior to labs  pt requesting referral to endo.

## 2023-07-24 ENCOUNTER — Ambulatory Visit: Payer: 59 | Admitting: Family Medicine

## 2023-08-20 ENCOUNTER — Ambulatory Visit (INDEPENDENT_AMBULATORY_CARE_PROVIDER_SITE_OTHER): Payer: 59 | Admitting: Internal Medicine

## 2023-08-20 ENCOUNTER — Encounter: Payer: Self-pay | Admitting: Internal Medicine

## 2023-08-20 VITALS — BP 120/70 | HR 70 | Ht 62.0 in | Wt 162.8 lb

## 2023-08-20 DIAGNOSIS — E063 Autoimmune thyroiditis: Secondary | ICD-10-CM | POA: Diagnosis not present

## 2023-08-20 DIAGNOSIS — E038 Other specified hypothyroidism: Secondary | ICD-10-CM

## 2023-08-20 NOTE — Patient Instructions (Signed)
Please stop at the lab.  Please continue levothyroxine 175 mcg daily.  Take the thyroid hormone every day, with water, at least 30 minutes before breakfast, separated by at least 4 hours from: - acid reflux medications - calcium - iron - multivitamins  Please return in  3-4 months.

## 2023-08-20 NOTE — Progress Notes (Unsigned)
Patient ID: Abigail Goodwin, female   DOB: 1971/07/11, 52 y.o.   MRN: [id number]  HPI  Abigail Goodwin is a 52 y.o.-year-old female, referred by her PCP, Dr.Kulik, for management of uncontrolled Hashimoto's hypothyroidism.  Pt. was dx'ed  with hypothyroidism in 2023 when pregnant with 1st child (at 82 y/o) during the period of increased stress. She started on Synthroid brand name, then changed to LT4 generic.   Pt. is on levothyroxine 175 mcg daily (last dose increase 07/16/2023), taken: - occasional misses doses - in am - fasting - at least 30 min from tea + milk, b'fast - + calcium  - 4h later - + iron - 4h later - + multivitamins with iron - started 1 week ago  - 4h later - no PPIs - + on Biotin - (B complex) - 30 mcg-last dose 2 days ago  I reviewed pt's thyroid tests: Lab Results  Component Value Date   TSH 14.03 (H) 07/09/2023   TSH 1.26 01/31/2023   TSH 0.09 (L) 11/25/2021   TSH 17.78 (H) 10/07/2021   TSH 4.16 01/17/2021   TSH 0.04 (L) 12/03/2020   TSH 15.87 (H) 09/02/2020   TSH 0.08 (L) 04/02/2020   TSH 0.30 (L) 12/24/2019   TSH 5.84 (H) 08/29/2019   FREET4 1.15 11/25/2021   FREET4 1.32 12/03/2020   FREET4 1.1 09/02/2020   FREET4 1.34 04/02/2020   FREET4 0.84 12/24/2019   FREET4 1.0 08/29/2019   FREET4 1.23 05/10/2017   FREET4 1.34 11/27/2012   T3FREE 3.0 11/25/2021   Antithyroid antibodies: No results found for: "THGAB" No components found for: "TPOAB"  Pt describes: - occasional fatigue - some weight gain - no cold intolerance, but feels more cold usually - no constipation - no dry skin - some hair loss  Pt denies: - feeling nodules in neck - hoarseness - dysphagia - choking  She has + FH of thyroid disorders in: mother - possibly Hashimotos (late 30s). No FH of thyroid cancer. No h/o radiation tx to head or neck. No recent use of iodine supplements.  No herbal supplements.  No recent steroid use. Prev. Ashwaghanda use.  Pt. also has a history of mixed  hyperlipidemia, vitamin D deficiency, IDA due to chronic blood loss, history of hepatitis. She is G4, P2. She also has a history of elevated HbA1c, with the latest level being improved after she eliminated sugar in her tea: Lab Results  Component Value Date   HGBA1C 5.7 07/09/2023   HGBA1C 5.9 01/31/2023   HGBA1C 5.3 04/02/2020   She is vegetarian.  ROS: Constitutional: + weight gain, no weight loss, + fatigue, no subjective hyperthermia, no subjective hypothermia, no nocturia Eyes: no blurry vision, no xerophthalmia ENT: no sore throat, no nodules palpated in neck, no dysphagia, no odynophagia, no hoarseness, no tinnitus, no hypoacusis Cardiovascular: no CP, no SOB, no palpitations, no leg swelling Respiratory: no cough, no SOB, no wheezing Gastrointestinal: no N, no V, no D, no C, no acid reflux Musculoskeletal: no muscle, no joint aches Skin: no rash, + hair loss Neurological: no tremors, no numbness or tingling/no dizziness/no HAs Psychiatric: no depression, no anxiety  Past Medical History:  Diagnosis Date   Hepatitis at age 58    Hypothyroidism    Miscarriage    Past Surgical History:  Procedure Laterality Date   CESAREAN SECTION     DILATION AND EVACUATION  06/09/2011   Procedure: DILATATION AND EVACUATION (D&E) 2ND TRIMESTER;  Surgeon: Jeani Hawking, MD;  Location: [facility];  Service: Gynecology;  Laterality: N/A;  14 weeks/ultrasound and chromosome studies   WISDOM TOOTH EXTRACTION     Social History   Socioeconomic History   Marital status: Married    Spouse name: Not on file   Number of children: 2   Years of education: Not on file   Highest education level: Master's degree (e.g., MA, MS, MEng, MEd, MSW, MBA)  Occupational History   Occupation: tutor    Comment: reading  Tobacco Use   Smoking status: Never   Smokeless tobacco: Never  Vaping Use   Vaping status: Never Used  Substance and Sexual Activity   Alcohol use: No   Drug use: No   Sexual  activity: Yes    Comment: Pregnant  Other Topics Concern   Not on file  Social History Narrative   Not on file   Social Determinants of Health   Financial Resource Strain: Not on file  Food Insecurity: Not on file  Transportation Needs: Not on file  Physical Activity: Not on file  Stress: Not on file  Social Connections: Not on file  Intimate Partner Violence: Not on file   Current Outpatient Medications on File Prior to Visit  Medication Sig Dispense Refill   B Complex Vitamins (B COMPLEX 1 PO) Take by mouth. (Patient not taking: Reported on 01/31/2023)     Cholecalciferol (VITAMIN D) 50 MCG (2000 UT) CAPS Take by mouth. (Patient not taking: Reported on 01/31/2023)     clindamycin (CLEOCIN T) 1 % external solution Apply topically 2 (two) times daily. 60 mL 3   ferrous sulfate 325 (65 FE) MG tablet Take 325 mg by mouth daily with breakfast. (Patient not taking: Reported on 01/31/2023)     levothyroxine (SYNTHROID) 175 MCG tablet Take 1 tablet (175 mcg total) by mouth daily. 90 tablet 0   Omega-3 Fatty Acids (FISH OIL PO) Take by mouth. 1400 mg (total) /Twice daily     Vitamin D, Ergocalciferol, (DRISDOL) 1.25 MG (50000 UNIT) CAPS capsule Take 1 capsule (50,000 Units total) by mouth every 7 (seven) days. 13 capsule 0   No current facility-administered medications on file prior to visit.   Allergies  Allergen Reactions   Ibuprofen Swelling    Swelling/rash on face   Ibuprofen Rash and Swelling    Swelling/rash on face   Family History  Problem Relation Age of Onset   Thyroid disease Mother    Dysphagia Mother    Hypertension Father    Diabetes Father    PE: BP 120/70   Pulse 70   Ht 5\' 2"  (1.575 m)   Wt 162 lb 12.8 oz (73.8 kg)   SpO2 98%   BMI 29.78 kg/m  Wt Readings from Last 3 Encounters:  08/20/23 162 lb 12.8 oz (73.8 kg)  07/16/23 160 lb 8 oz (72.8 kg)  01/31/23 162 lb 4 oz (73.6 kg)   Constitutional: overweight, in NAD Eyes:  EOMI, no exophthalmos ENT: no  neck masses, no cervical lymphadenopathy Cardiovascular: RRR, No MRG Respiratory: CTA B Musculoskeletal: no deformities Skin:no rashes Neurological: no tremor with outstretched hands  ASSESSMENT: 1. Hypothyroidism due to Hashimoto's thyroiditis  2.  Elevated HbA1c  3.  Hyperlipidemia  PLAN:  1. Patient with long-standing hypothyroidism, on levothyroxine therapy. - she does not appear to have a goiter, thyroid nodules, or neck compression symptoms - latest thyroid labs reviewed with pt. >> TSH is elevated: Lab Results  Component Value Date   TSH 14.03 (H) 07/09/2023  -  she continues on LT4 175 mcg daily, dose increased after the above results returned - pt feels better after the dose was increased. - we discussed about taking the thyroid hormone every day, with water, >30 minutes before breakfast, separated by >4 hours from acid reflux medications, calcium, iron, multivitamins. Pt. is taking it correctly, however, she mentions that she occasionally skips doses as she forgets them.  Upon questioning, she keeps the levothyroxine bottle in the kitchen.  We discussed about moving this on the nightstand.  It would be imperative for her not to miss any more doses to be able to keep stable TSH levels.   -Reviewing her TSH levels, there is a pattern of increased TSH levels when she was forgetting doses, after which her levothyroxine doses were increased, followed by excessively suppressed TSH levels after which the cycle resumed. - will check thyroid tests today: TSH, fT3, and fT4 and will also add TPO and ATA antibodies - If labs are abnormal, she will need to return for repeat TFTs in 1.5 months - Otherwise, I will see her back in 3-4 months  2.  Elevated HbA1c -It is possible that this was due to increased insulin resistance in the setting of elevated TSH associated with weight gain -Plan to repeat her HbA1c at next visit  3. HL - Reviewed latest lipid panel from 06/2023, elevated LDL,  slightly high triglycerides: Lab Results  Component Value Date   CHOL 223 (H) 07/09/2023   HDL 44.90 07/09/2023   LDLCALC 145 (H) 07/09/2023   LDLDIRECT 132.0 05/10/2017   TRIG 164.0 (H) 07/09/2023   CHOLHDL 5 07/09/2023  -She is only on omega-3 fatty acids.  As of now, she is not on a statin but plan to reevaluate her lipid panel after her TFTs normalized and remained stable for several months and then see if she needs to start on statins, as we discussed that an elevated TSH can be associated with increased LDL.  Carlus Pavlov, MD PhD [facility] Endocrinology

## 2023-08-21 ENCOUNTER — Other Ambulatory Visit (HOSPITAL_BASED_OUTPATIENT_CLINIC_OR_DEPARTMENT_OTHER): Payer: Self-pay

## 2023-08-21 LAB — T4, FREE: Free T4: 1.8 ng/dL (ref 0.8–1.8)

## 2023-08-21 LAB — T3, FREE: T3, Free: 3.7 pg/mL (ref 2.3–4.2)

## 2023-08-21 LAB — TSH: TSH: 0.04 m[IU]/L — ABNORMAL LOW

## 2023-08-21 MED ORDER — LEVOTHYROXINE SODIUM 150 MCG PO TABS
150.0000 ug | ORAL_TABLET | Freq: Every day | ORAL | 2 refills | Status: DC
  Filled 2023-08-21: qty 45, 45d supply, fill #0
  Filled 2023-10-15: qty 45, 45d supply, fill #1
  Filled 2023-12-11: qty 45, 45d supply, fill #2

## 2023-08-24 ENCOUNTER — Other Ambulatory Visit (HOSPITAL_BASED_OUTPATIENT_CLINIC_OR_DEPARTMENT_OTHER): Payer: Self-pay

## 2023-08-27 ENCOUNTER — Other Ambulatory Visit: Payer: 59

## 2023-09-27 ENCOUNTER — Other Ambulatory Visit: Payer: 59

## 2023-10-15 ENCOUNTER — Other Ambulatory Visit (HOSPITAL_BASED_OUTPATIENT_CLINIC_OR_DEPARTMENT_OTHER): Payer: Self-pay

## 2023-12-11 ENCOUNTER — Other Ambulatory Visit (HOSPITAL_BASED_OUTPATIENT_CLINIC_OR_DEPARTMENT_OTHER): Payer: Self-pay

## 2023-12-20 ENCOUNTER — Encounter: Payer: Self-pay | Admitting: Internal Medicine

## 2023-12-20 ENCOUNTER — Ambulatory Visit (INDEPENDENT_AMBULATORY_CARE_PROVIDER_SITE_OTHER): Payer: 59 | Admitting: Internal Medicine

## 2023-12-20 VITALS — BP 116/68 | HR 80 | Ht 62.0 in | Wt 164.2 lb

## 2023-12-20 DIAGNOSIS — E063 Autoimmune thyroiditis: Secondary | ICD-10-CM

## 2023-12-20 DIAGNOSIS — R7309 Other abnormal glucose: Secondary | ICD-10-CM | POA: Diagnosis not present

## 2023-12-20 DIAGNOSIS — E7849 Other hyperlipidemia: Secondary | ICD-10-CM

## 2023-12-20 DIAGNOSIS — E038 Other specified hypothyroidism: Secondary | ICD-10-CM | POA: Diagnosis not present

## 2023-12-20 NOTE — Progress Notes (Signed)
 Patient ID: Abigail Goodwin, female   DOB: Mar 29, 1971, 53 y.o.   MRN: [id number]  HPI  Abigail Goodwin is a 53 y.o.-year-old female, initially referred by her PCP, Dr.Kulik, returning for follow-up for uncontrolled Hashimoto's hypothyroidism.  Last visit 4 months ago.  Interim history: She feels well at today's visit, without complaints.  She feels better lately, with less fatigue and more energy.  Reviewed history: Pt. was dx'ed  with hypothyroidism in 2023 when pregnant with 1st child (at 53 y/o) during the period of increased stress. She started on Synthroid brand name, then changed to LT4 generic.   Pt. is on levothyroxine 150 mcg daily (dose decreased 08/2023) - no more misses doses - in am - fasting - at least 30 min from tea + milk, b'fast - + calcium  - 4h later - + iron - 4h later - + multivitamins with iron - 4h later - no PPIs - + on Biotin - (B complex) -not in last month  I reviewed pt's thyroid tests: Lab Results  Component Value Date   TSH 0.04 (L) 08/20/2023   TSH 14.03 (H) 07/09/2023   TSH 1.26 01/31/2023   TSH 0.09 (L) 11/25/2021   TSH 17.78 (H) 10/07/2021   TSH 4.16 01/17/2021   TSH 0.04 (L) 12/03/2020   TSH 15.87 (H) 09/02/2020   TSH 0.08 (L) 04/02/2020   TSH 0.30 (L) 12/24/2019   FREET4 1.8 08/20/2023   FREET4 1.15 11/25/2021   FREET4 1.32 12/03/2020   FREET4 1.1 09/02/2020   FREET4 1.34 04/02/2020   FREET4 0.84 12/24/2019   FREET4 1.0 08/29/2019   FREET4 1.23 05/10/2017   FREET4 1.34 11/27/2012   T3FREE 3.7 08/20/2023   T3FREE 3.0 11/25/2021   Antithyroid antibodies: No results found for: "THGAB" No components found for: "TPOAB"  At our first visit, she described: - occasional fatigue - some weight gain - no cold intolerance, but feels more cold usually - no constipation - no dry skin - some hair loss She started to feel better afterwards.  Pt denies: - feeling nodules in neck - hoarseness - dysphagia - choking  She has + FH of thyroid  disorders in: mother - possibly Hashimotos (late 30s). No FH of thyroid cancer. No h/o radiation tx to head or neck. No recent use of iodine supplements.  No herbal supplements (previously used ashwagandha, not currently).  No recent steroid use.   Pt. also has a history of mixed hyperlipidemia, vitamin D deficiency, IDA due to chronic blood loss, history of hepatitis. She is G4, P2. She also has a history of elevated HbA1c, with the latest level being improved after she eliminated sugar in her tea: Lab Results  Component Value Date   HGBA1C 5.7 07/09/2023   HGBA1C 5.9 01/31/2023   HGBA1C 5.3 04/02/2020   She is vegetarian.  ROS: + see HPI  Past Medical History:  Diagnosis Date   Hepatitis at age 16    Hypothyroidism    Miscarriage    Past Surgical History:  Procedure Laterality Date   CESAREAN SECTION     DILATION AND EVACUATION  06/09/2011   Procedure: DILATATION AND EVACUATION (D&E) 2ND TRIMESTER;  Surgeon: Jeani Hawking, MD;  Location: [facility];  Service: Gynecology;  Laterality: N/A;  14 weeks/ultrasound and chromosome studies   WISDOM TOOTH EXTRACTION     Social History   Socioeconomic History   Marital status: Married    Spouse name: Chistina Roston   Number of children: 2   Years  of education: Not on file   Highest education level: Master's degree (e.g., MA, MS, MEng, MEd, MSW, MBA)  Occupational History   Occupation: tutor    Comment: reading  Tobacco Use   Smoking status: Never   Smokeless tobacco: Never  Vaping Use   Vaping status: Never Used  Substance and Sexual Activity   Alcohol use: No   Drug use: No   Sexual activity: Yes  Other Topics Concern   Not on file  Social History Narrative   2 daughters: 54 and 81 years old in 08/2023.   Social Drivers of Corporate investment banker Strain: Not on file  Food Insecurity: Not on file  Transportation Needs: Not on file  Physical Activity: Not on file  Stress: Not on file  Social Connections: Not on  file  Intimate Partner Violence: Not on file   Current Outpatient Medications on File Prior to Visit  Medication Sig Dispense Refill   B Complex Vitamins (B COMPLEX 1 PO) Take by mouth.     Cholecalciferol (VITAMIN D) 50 MCG (2000 UT) CAPS Take by mouth.     clindamycin (CLEOCIN T) 1 % external solution Apply topically 2 (two) times daily. 60 mL 3   Cobalamin Combinations (COBALAMINE COMBINATIONS SL) Place under the tongue.     ferrous sulfate 325 (65 FE) MG tablet Take 325 mg by mouth daily with breakfast.     levothyroxine (SYNTHROID) 150 MCG tablet Take 1 tablet (150 mcg total) by mouth daily. 45 tablet 2   Multiple Vitamins-Minerals (ONE-A-DAY WOMENS 50 PLUS PO) Take by mouth.     Omega-3 Fatty Acids (FISH OIL PO) Take by mouth. 1400 mg (total) /Twice daily     Vitamin D, Ergocalciferol, (DRISDOL) 1.25 MG (50000 UNIT) CAPS capsule Take 1 capsule (50,000 Units total) by mouth every 7 (seven) days. 13 capsule 0   No current facility-administered medications on file prior to visit.   Allergies  Allergen Reactions   Ibuprofen Swelling    Swelling/rash on face   Ibuprofen Rash and Swelling    Swelling/rash on face   Family History  Problem Relation Age of Onset   Cancer Mother    Thyroid disease Mother    Dysphagia Mother    Hypertension Father    Diabetes Father    PE: BP 116/68   Pulse 80   Ht 5\' 2"  (1.575 m)   Wt 164 lb 3.2 oz (74.5 kg)   SpO2 97%   BMI 30.03 kg/m  Wt Readings from Last 3 Encounters:  12/20/23 164 lb 3.2 oz (74.5 kg)  08/20/23 162 lb 12.8 oz (73.8 kg)  07/16/23 160 lb 8 oz (72.8 kg)   Constitutional: Slightly overweight, in NAD Eyes:  EOMI, no exophthalmos ENT: no neck masses, no cervical lymphadenopathy Cardiovascular: RRR, No MRG Respiratory: CTA B Musculoskeletal: no deformities Skin:no rashes Neurological: no tremor with outstretched hands  ASSESSMENT: 1. Hypothyroidism due to Hashimoto's thyroiditis  2.  Elevated HbA1c  3.   Hyperlipidemia  PLAN:  1. Patient with longstanding uncontrolled hypothyroidism, on levothyroxine therapy. - No goiter, thyroid nodules or neck compression symptoms - latest thyroid labs reviewed with pt. >> TSH was suppressed after she started to take the levothyroxine correctly: Lab Results  Component Value Date   TSH 0.04 (L) 08/20/2023  - she continues on LT4 150 mcg daily, dose decreased after the above results returned - pt feels better on this dose, with less fatigue. - we discussed about taking the thyroid  hormone every day, with water, >30 minutes before breakfast, separated by >4 hours from acid reflux medications, calcium, iron, multivitamins. Pt. is taking it correctly.  She was occasionally skipping doses before as she was forgetting to take them.  She was given levothyroxine in the kitchen and I advised her about moving it to the nightstand.  We did discuss that if she forgot 1 dose to take 2 doses the next day.  In the past, when the TSH returned elevated due to missing doses, her levothyroxine doses were increased, with subsequently suppressed TSH.  I am hoping that by taking it correctly, we can interrupt this cycle. - will check thyroid tests today: TSH and fT4 - If labs are abnormal, she will need to return for repeat TFTs in 1.5 months - Otherwise, I will see her back in 6 months  2.  Elevated HbA1c -It is possible that this was due to increased insulin resistance in the setting of elevated TSH associated with weight gain -Will repeat her HbA1c today  3. HL -Latest lipid panel was reviewed: LDL elevated, triglycerides slightly high: Lab Results  Component Value Date   CHOL 223 (H) 07/09/2023   HDL 44.90 07/09/2023   LDLCALC 145 (H) 07/09/2023   LDLDIRECT 132.0 05/10/2017   TRIG 164.0 (H) 07/09/2023   CHOLHDL 5 07/09/2023  -She is on omega-3 fatty acids.  Will recheck her lipid panel now.  She is not fasting, had tea and a bagel.  Needs refills.  Orders Placed  This Encounter  Procedures   TSH   T4, free   Hemoglobin A1c   Lipid Panel w/reflex Direct LDL   Carlus Pavlov, MD PhD [facility]

## 2023-12-20 NOTE — Patient Instructions (Signed)
 Please stop at the lab.  Please continue levothyroxine 150 mcg daily.  Take the thyroid hormone every day, with water, at least 30 minutes before breakfast, separated by at least 4 hours from: - acid reflux medications - calcium - iron - multivitamins  Please return in  6 months.

## 2023-12-21 ENCOUNTER — Other Ambulatory Visit (HOSPITAL_COMMUNITY): Payer: Self-pay

## 2023-12-21 ENCOUNTER — Encounter: Payer: Self-pay | Admitting: Internal Medicine

## 2023-12-21 LAB — LIPID PANEL W/REFLEX DIRECT LDL
Cholesterol: 220 mg/dL — ABNORMAL HIGH (ref <200)
HDL: 50 mg/dL (ref >=50)
LDL Cholesterol (Calc): 130 mg/dL — ABNORMAL HIGH
Non-HDL Cholesterol (Calc): 170 mg/dL — ABNORMAL HIGH (ref <130)
Total CHOL/HDL Ratio: 4.4 (calc) (ref <5.0)
Triglycerides: 245 mg/dL — ABNORMAL HIGH (ref <150)

## 2023-12-21 LAB — TSH: TSH: 0.59 m[IU]/L

## 2023-12-21 LAB — HEMOGLOBIN A1C
Hgb A1c MFr Bld: 5.8 %{Hb} — ABNORMAL HIGH (ref <5.7)
Mean Plasma Glucose: 120 mg/dL
eAG (mmol/L): 6.6 mmol/L

## 2023-12-21 LAB — T4, FREE: Free T4: 1.3 ng/dL (ref 0.8–1.8)

## 2023-12-21 MED ORDER — LEVOTHYROXINE SODIUM 150 MCG PO TABS
150.0000 ug | ORAL_TABLET | Freq: Every day | ORAL | 1 refills | Status: DC
  Filled 2023-12-21 – 2024-01-20 (×2): qty 90, 90d supply, fill #0
  Filled 2024-05-22: qty 90, 90d supply, fill #1

## 2023-12-21 NOTE — Addendum Note (Signed)
 Addended by: Carlus Pavlov on: 12/21/2023 03:41 PM   Modules accepted: Orders

## 2023-12-24 DIAGNOSIS — N959 Unspecified menopausal and perimenopausal disorder: Secondary | ICD-10-CM | POA: Diagnosis not present

## 2023-12-24 DIAGNOSIS — Z8041 Family history of malignant neoplasm of ovary: Secondary | ICD-10-CM | POA: Diagnosis not present

## 2023-12-24 DIAGNOSIS — Z6829 Body mass index (BMI) 29.0-29.9, adult: Secondary | ICD-10-CM | POA: Diagnosis not present

## 2023-12-24 DIAGNOSIS — Z808 Family history of malignant neoplasm of other organs or systems: Secondary | ICD-10-CM | POA: Diagnosis not present

## 2023-12-24 DIAGNOSIS — Z1231 Encounter for screening mammogram for malignant neoplasm of breast: Secondary | ICD-10-CM | POA: Diagnosis not present

## 2023-12-24 DIAGNOSIS — Z803 Family history of malignant neoplasm of breast: Secondary | ICD-10-CM | POA: Diagnosis not present

## 2023-12-24 DIAGNOSIS — Z124 Encounter for screening for malignant neoplasm of cervix: Secondary | ICD-10-CM | POA: Diagnosis not present

## 2023-12-24 DIAGNOSIS — Z01419 Encounter for gynecological examination (general) (routine) without abnormal findings: Secondary | ICD-10-CM | POA: Diagnosis not present

## 2023-12-24 DIAGNOSIS — Z1211 Encounter for screening for malignant neoplasm of colon: Secondary | ICD-10-CM | POA: Diagnosis not present

## 2024-01-15 ENCOUNTER — Ambulatory Visit: Payer: 59 | Admitting: Family Medicine

## 2024-01-18 ENCOUNTER — Other Ambulatory Visit (HOSPITAL_COMMUNITY): Payer: Self-pay

## 2024-01-18 DIAGNOSIS — R102 Pelvic and perineal pain: Secondary | ICD-10-CM | POA: Diagnosis not present

## 2024-01-18 DIAGNOSIS — D509 Iron deficiency anemia, unspecified: Secondary | ICD-10-CM | POA: Diagnosis not present

## 2024-01-18 DIAGNOSIS — N939 Abnormal uterine and vaginal bleeding, unspecified: Secondary | ICD-10-CM | POA: Diagnosis not present

## 2024-01-18 MED ORDER — MEDROXYPROGESTERONE ACETATE 10 MG PO TABS
10.0000 mg | ORAL_TABLET | Freq: Every day | ORAL | 0 refills | Status: DC
  Filled 2024-01-18: qty 14, 14d supply, fill #0

## 2024-01-18 MED ORDER — FERROUS SULFATE 325 (65 FE) MG PO TABS
325.0000 mg | ORAL_TABLET | Freq: Every day | ORAL | 0 refills | Status: AC
  Filled 2024-01-18: qty 30, 30d supply, fill #0

## 2024-01-21 ENCOUNTER — Other Ambulatory Visit: Payer: Self-pay

## 2024-01-21 ENCOUNTER — Other Ambulatory Visit (HOSPITAL_COMMUNITY): Payer: Self-pay

## 2024-01-25 ENCOUNTER — Other Ambulatory Visit: Payer: Self-pay | Admitting: Family Medicine

## 2024-01-25 ENCOUNTER — Other Ambulatory Visit (HOSPITAL_COMMUNITY): Payer: Self-pay

## 2024-01-25 NOTE — Telephone Encounter (Signed)
Needs to sch appt

## 2024-01-28 ENCOUNTER — Other Ambulatory Visit (HOSPITAL_COMMUNITY): Payer: Self-pay

## 2024-02-04 ENCOUNTER — Other Ambulatory Visit (HOSPITAL_COMMUNITY): Payer: Self-pay

## 2024-02-04 ENCOUNTER — Other Ambulatory Visit (HOSPITAL_BASED_OUTPATIENT_CLINIC_OR_DEPARTMENT_OTHER): Payer: Self-pay

## 2024-02-04 DIAGNOSIS — N926 Irregular menstruation, unspecified: Secondary | ICD-10-CM | POA: Diagnosis not present

## 2024-02-04 DIAGNOSIS — E7849 Other hyperlipidemia: Secondary | ICD-10-CM | POA: Diagnosis not present

## 2024-02-04 DIAGNOSIS — N939 Abnormal uterine and vaginal bleeding, unspecified: Secondary | ICD-10-CM | POA: Diagnosis not present

## 2024-02-04 MED ORDER — METFORMIN HCL ER 500 MG PO TB24
500.0000 mg | ORAL_TABLET | Freq: Every day | ORAL | 3 refills | Status: AC
  Filled 2024-02-04 – 2024-05-22 (×3): qty 90, 90d supply, fill #0

## 2024-02-04 MED ORDER — MEDROXYPROGESTERONE ACETATE 10 MG PO TABS
10.0000 mg | ORAL_TABLET | Freq: Every day | ORAL | 3 refills | Status: AC
  Filled 2024-02-04 – 2024-02-05 (×2): qty 52, 52d supply, fill #0

## 2024-02-05 ENCOUNTER — Other Ambulatory Visit (HOSPITAL_BASED_OUTPATIENT_CLINIC_OR_DEPARTMENT_OTHER): Payer: Self-pay

## 2024-02-18 ENCOUNTER — Encounter: Payer: Self-pay | Admitting: Obstetrics and Gynecology

## 2024-02-28 ENCOUNTER — Other Ambulatory Visit (HOSPITAL_BASED_OUTPATIENT_CLINIC_OR_DEPARTMENT_OTHER): Payer: Self-pay

## 2024-05-22 ENCOUNTER — Other Ambulatory Visit (HOSPITAL_COMMUNITY): Payer: Self-pay

## 2024-05-22 ENCOUNTER — Other Ambulatory Visit: Payer: Self-pay

## 2024-05-22 ENCOUNTER — Other Ambulatory Visit (HOSPITAL_BASED_OUTPATIENT_CLINIC_OR_DEPARTMENT_OTHER): Payer: Self-pay

## 2024-06-20 ENCOUNTER — Ambulatory Visit (INDEPENDENT_AMBULATORY_CARE_PROVIDER_SITE_OTHER): Admitting: Internal Medicine

## 2024-06-20 ENCOUNTER — Encounter: Payer: Self-pay | Admitting: Internal Medicine

## 2024-06-20 VITALS — BP 118/68 | HR 77 | Ht 62.0 in | Wt 157.4 lb

## 2024-06-20 DIAGNOSIS — E7849 Other hyperlipidemia: Secondary | ICD-10-CM | POA: Diagnosis not present

## 2024-06-20 DIAGNOSIS — E559 Vitamin D deficiency, unspecified: Secondary | ICD-10-CM

## 2024-06-20 DIAGNOSIS — E063 Autoimmune thyroiditis: Secondary | ICD-10-CM

## 2024-06-20 DIAGNOSIS — R7309 Other abnormal glucose: Secondary | ICD-10-CM

## 2024-06-20 DIAGNOSIS — E038 Other specified hypothyroidism: Secondary | ICD-10-CM

## 2024-06-20 NOTE — Progress Notes (Addendum)
 Patient ID: Abigail Goodwin, female   DOB: 03/31/1971, 53 y.o.   MRN: [id number]  HPI  Abigail Goodwin is a 53 y.o.-year-old female, initially referred by her PCP, Dr.Kulik, returning for follow-up for uncontrolled Hashimoto's hypothyroidism, elevated HbA1c, and hyperlipidemia.  Last visit 6 months ago.  Interim history: She feels well at today's visit, without complaints.  She had increased uterine bleeding in 01/2024 >> dx'ed with fibroids. She was started on a low-dose metformin  to help with this and also progesterone.  She was able to stop the progesterone as the bleeding stopped.  However, she continued metformin .  Reviewed history: Pt. was dx'ed  with hypothyroidism in 2023 when pregnant with 1st child (at 53 y/o) during the period of increased stress. She started on Synthroid  brand name, then changed to LT4 generic.   Pt. is on levothyroxine  150 mcg daily (dose decreased 08/2023): - no more misses doses - in am - fasting - at least 30 min from tea + milk, b'fast - Stopped calcium  - 4h later - Stopped iron - 4h later - Stopped multivitamins with iron - 4h later - no PPIs - + on Biotin - (B complex) -not recently  I reviewed pt's thyroid  tests: Lab Results  Component Value Date   TSH 0.59 12/20/2023   TSH 0.04 (L) 08/20/2023   TSH 14.03 (H) 07/09/2023   TSH 1.26 01/31/2023   TSH 0.09 (L) 11/25/2021   TSH 17.78 (H) 10/07/2021   TSH 4.16 01/17/2021   TSH 0.04 (L) 12/03/2020   TSH 15.87 (H) 09/02/2020   TSH 0.08 (L) 04/02/2020   FREET4 1.3 12/20/2023   FREET4 1.8 08/20/2023   FREET4 1.15 11/25/2021   FREET4 1.32 12/03/2020   FREET4 1.1 09/02/2020   FREET4 1.34 04/02/2020   FREET4 0.84 12/24/2019   FREET4 1.0 08/29/2019   FREET4 1.23 05/10/2017   FREET4 1.34 11/27/2012   T3FREE 3.7 08/20/2023   T3FREE 3.0 11/25/2021   Antithyroid antibodies: No results found for: THGAB No components found for: TPOAB  At our first visit, she described: - occasional fatigue - some weight  gain - no cold intolerance, but feels more cold usually - no constipation - no dry skin - some hair loss She started to feel better afterwards.  Pt denies: - feeling nodules in neck - hoarseness - dysphagia - choking  She has + FH of thyroid  disorders in: mother - possibly Hashimotos (late 30s). No FH of thyroid  cancer. No h/o radiation tx to head or neck. No recent use of iodine supplements.  No herbal supplements (previously used ashwagandha, not currently).  No recent steroid use.   Pt. also has a history of mixed hyperlipidemia, vitamin D  deficiency, IDA due to chronic blood loss, history of hepatitis. She is G4, P2. She also has a history of elevated HbA1c: Lab Results  Component Value Date   HGBA1C 5.8 (H) 12/20/2023   HGBA1C 5.7 07/09/2023   HGBA1C 5.9 01/31/2023  Dr. Mat started her on metformin  ER 500 mg daily.  She tolerates this well.  She has a history of hyperlipidemia: Lab Results  Component Value Date   CHOL 220 (H) 12/20/2023   CHOL 223 (H) 07/09/2023   CHOL 211 (H) 01/31/2023   Lab Results  Component Value Date   HDL 50 12/20/2023   HDL 44.90 07/09/2023   HDL 40.70 01/31/2023   Lab Results  Component Value Date   LDLCALC 130 (H) 12/20/2023   LDLCALC 145 (H) 07/09/2023   LDLCALC 150 (H)  01/31/2023   Lab Results  Component Value Date   TRIG 245 (H) 12/20/2023   TRIG 164.0 (H) 07/09/2023   TRIG 100.0 01/31/2023   Lab Results  Component Value Date   CHOLHDL 4.4 12/20/2023   CHOLHDL 5 07/09/2023   CHOLHDL 5 01/31/2023   Lab Results  Component Value Date   LDLDIRECT 132.0 05/10/2017    In 12/2023, I advised her to increase fish oil to 1000 mg twice a day.  She is vegetarian.  ROS: + see HPI  Past Medical History:  Diagnosis Date   Hepatitis at age 53    Hypothyroidism    Miscarriage    Past Surgical History:  Procedure Laterality Date   CESAREAN SECTION     DILATION AND EVACUATION  06/09/2011   Procedure: DILATATION AND  EVACUATION (D&E) 2ND TRIMESTER;  Surgeon: Rosaline LITTIE Cobble, MD;  Location: [facility];  Service: Gynecology;  Laterality: N/A;  14 weeks/ultrasound and chromosome studies   WISDOM TOOTH EXTRACTION     Social History   Socioeconomic History   Marital status: Married    Spouse name: Ellenore Roscoe   Number of children: 2   Years of education: Not on file   Highest education level: Master's degree (e.g., MA, MS, MEng, MEd, MSW, MBA)  Occupational History   Occupation: tutor    Comment: reading  Tobacco Use   Smoking status: Never   Smokeless tobacco: Never  Vaping Use   Vaping status: Never Used  Substance and Sexual Activity   Alcohol use: No   Drug use: No   Sexual activity: Yes  Other Topics Concern   Not on file  Social History Narrative   2 daughters: 68 and 60 years old in 08/2023.   Social Drivers of Corporate investment banker Strain: Not on file  Food Insecurity: Not on file  Transportation Needs: Not on file  Physical Activity: Not on file  Stress: Not on file  Social Connections: Not on file  Intimate Partner Violence: Not on file   Current Outpatient Medications on File Prior to Visit  Medication Sig Dispense Refill   B Complex Vitamins (B COMPLEX 1 PO) Take by mouth.     Cholecalciferol (VITAMIN D ) 50 MCG (2000 UT) CAPS Take by mouth.     clindamycin  (CLEOCIN  T) 1 % external solution Apply topically 2 (two) times daily. 60 mL 3   Cobalamin Combinations (COBALAMINE COMBINATIONS SL) Place under the tongue.     ferrous sulfate  325 (65 FE) MG tablet Take 325 mg by mouth daily with breakfast.     ferrous sulfate  325 (65 FE) MG tablet Take 1 tablet (325 mg total) by mouth daily. 30 tablet 0   levothyroxine  (SYNTHROID ) 150 MCG tablet Take 1 tablet (150 mcg total) by mouth daily. 90 tablet 1   medroxyPROGESTERone  (PROVERA ) 10 MG tablet Take 1 tablet (10 mg total) by mouth daily for 14 days 52 tablet 3   metFORMIN  (GLUCOPHAGE -XR) 500 MG 24 hr tablet Take 1 tablet (500 mg  total) by mouth daily. 90 tablet 3   Multiple Vitamins-Minerals (ONE-A-DAY WOMENS 50 PLUS PO) Take by mouth.     Omega-3 Fatty Acids (FISH OIL PO) Take by mouth. 1400 mg (total) /Twice daily     Vitamin D , Ergocalciferol , (DRISDOL ) 1.25 MG (50000 UNIT) CAPS capsule Take 1 capsule (50,000 Units total) by mouth every 7 (seven) days. (Patient not taking: Reported on 12/20/2023) 13 capsule 0   No current facility-administered medications on file prior  to visit.   Allergies  Allergen Reactions   Ibuprofen Swelling    Swelling/rash on face   Ibuprofen Rash and Swelling    Swelling/rash on face   Family History  Problem Relation Age of Onset   Cancer Mother    Thyroid  disease Mother    Dysphagia Mother    Hypertension Father    Diabetes Father    PE: BP 118/68   Pulse 77   Ht 5' 2 (1.575 m)   Wt 157 lb 6.4 oz (71.4 kg)   SpO2 99%   BMI 28.79 kg/m  Wt Readings from Last 3 Encounters:  06/20/24 157 lb 6.4 oz (71.4 kg)  12/20/23 164 lb 3.2 oz (74.5 kg)  08/20/23 162 lb 12.8 oz (73.8 kg)   Constitutional: Slightly overweight, in NAD Eyes:  EOMI, no exophthalmos ENT: no neck masses, no cervical lymphadenopathy Cardiovascular: RRR, No MRG Respiratory: CTA B Musculoskeletal: no deformities Skin:no rashes Neurological: no tremor with outstretched hands  ASSESSMENT: 1. Hypothyroidism due to Hashimoto's thyroiditis  2.  Elevated HbA1c  3.  Hyperlipidemia  4.  Vitamin D  deficiency  PLAN:  1. Patient with longstanding uncontrolled hypothyroidism, on levothyroxine  therapy. - She denies neck compression symptoms - latest thyroid  labs reviewed with pt. >> normal: Lab Results  Component Value Date   TSH 0.59 12/20/2023  - she continues on LT4 150 mcg daily - pt feels good on this dose, without complaints today. - we discussed about taking the thyroid  hormone every day, with water, >30 minutes before breakfast, separated by >4 hours from acid reflux medications, calcium, iron,  multivitamins. Pt. is taking it correctly.  She was previously skipping doses as she was keeping the levothyroxine  tablet in the kitchen.  I advised her to moving down to the nightstand.  We also discussed that if she misses 1 dose, to take 2 doses the next day. - will check thyroid  tests today: TSH and fT4 - If labs are abnormal, she will need to return for repeat TFTs in 1.5 months - I plan to see her back in 6 months  2.  Elevated HbA1c - this was 5.8% at last visit, slightly higher, in the prediabetic range - she was started on metformin  ER 500 mg daily by Dr. Mat in 01/2024 to help with DUB, but she continued it even after the bleeding stopped, due to the elevated HbA1c.  She tolerates it well.  Will continue this for now.  She lost 7 pounds since last visit, possibly with the help of metformin . - Will repeat her HbA1c today.  Since she is on metformin , she requests a B12 level check.  We discussed that B12 absorption can be decreased with years of metformin  use, but will go ahead and recheck a level today.  3. HL - Latest lipid panel reviewed: LDL and triglycerides elevated: Lab Results  Component Value Date   CHOL 220 (H) 12/20/2023   HDL 50 12/20/2023   LDLCALC 130 (H) 12/20/2023   LDLDIRECT 132.0 05/10/2017   TRIG 245 (H) 12/20/2023   CHOLHDL 4.4 12/20/2023  - She is on omega-3 fatty acids.  We discussed about increasing fish oil to 1000 mg twice a day.  Since her ASCVD risk was quite low, at 1.4%, I did not recommend a statin but I recommended reducing fatty foods, increasing fiber, and exercise. -Will recheck her lipid panel now  4.  Vitamin D  deficiency -Not on a vitamin D  level now - Latest level was low: Lab Results  Component Value Date   VD25OH 23.32 (L) 07/09/2023  - Will recheck the level today per her request  Needs refills.  Orders Placed This Encounter  Procedures   TSH   T4, free   Hemoglobin A1c   Vitamin B12   VITAMIN D  25 Hydroxy (Vit-D Deficiency,  Fractures)   Lipid Panel w/reflex Direct LDL   Component     Latest Ref Rng 06/20/2024  Cholesterol     <200 mg/dL 762 (H)   HDL Cholesterol     > OR = 50 mg/dL 52   Triglycerides     <150 mg/dL 802 (H)   LDL Cholesterol (Calc)     mg/dL (calc) 848 (H)   Total CHOL/HDL Ratio     <5.0 (calc) 4.6   Non-HDL Cholesterol (Calc)     <130 mg/dL (calc) 814 (H)   TSH     mIU/L 0.02 (L)   Vitamin B12     200 - 1,100 pg/mL 433   Vitamin D , 25-Hydroxy     30 - 100 ng/mL 31   T4,Free(Direct)     0.8 - 1.8 ng/dL 1.7   Hemoglobin J8R     <5.7 % 5.8 (H)   Mean Plasma Glucose     mg/dL 879   eAG (mmol/L)     mmol/L 6.6   HbA1c stable, low in the prediabetic range. Vitamin B12 and D are normal.  However, we will advise him to start vitamin D  to prevent a drop in level. Lipid panel shows high triglycerides and an LDL is higher than before.  This could be related to menopause.  Also, uncontrolled hypothyroidism can influence the lipid panel.  I plan to repeat this after her thyroid  status normalizes. TSH is very suppressed >> we need to reduce the dose of her levothyroxine  from 150 mcg daily to 137 mcg daily.  Plan to repeat her TFTs in 1.5 months.  Lela Fendt, MD PhD [facility]

## 2024-06-20 NOTE — Patient Instructions (Signed)
 Please stop at the lab.  Please continue levothyroxine 150 mcg daily.  Take the thyroid hormone every day, with water, at least 30 minutes before breakfast, separated by at least 4 hours from: - acid reflux medications - calcium - iron - multivitamins  Please return in  6 months.

## 2024-06-21 LAB — LIPID PANEL W/REFLEX DIRECT LDL
Cholesterol: 237 mg/dL — ABNORMAL HIGH (ref <200)
HDL: 52 mg/dL (ref >=50)
LDL Cholesterol (Calc): 151 mg/dL — ABNORMAL HIGH
Non-HDL Cholesterol (Calc): 185 mg/dL — ABNORMAL HIGH (ref <130)
Total CHOL/HDL Ratio: 4.6 (calc) (ref <5.0)
Triglycerides: 197 mg/dL — ABNORMAL HIGH (ref <150)

## 2024-06-21 LAB — VITAMIN B12: Vitamin B-12: 433 pg/mL (ref 200–1100)

## 2024-06-21 LAB — HEMOGLOBIN A1C
Hgb A1c MFr Bld: 5.8 % — ABNORMAL HIGH (ref <5.7)
Mean Plasma Glucose: 120 mg/dL
eAG (mmol/L): 6.6 mmol/L

## 2024-06-21 LAB — TSH: TSH: 0.02 m[IU]/L — ABNORMAL LOW

## 2024-06-21 LAB — VITAMIN D 25 HYDROXY (VIT D DEFICIENCY, FRACTURES): Vit D, 25-Hydroxy: 31 ng/mL (ref 30–100)

## 2024-06-21 LAB — T4, FREE: Free T4: 1.7 ng/dL (ref 0.8–1.8)

## 2024-06-23 ENCOUNTER — Ambulatory Visit: Payer: Self-pay | Admitting: Internal Medicine

## 2024-06-23 ENCOUNTER — Other Ambulatory Visit (HOSPITAL_BASED_OUTPATIENT_CLINIC_OR_DEPARTMENT_OTHER): Payer: Self-pay

## 2024-06-23 MED ORDER — LEVOTHYROXINE SODIUM 137 MCG PO TABS
150.0000 ug | ORAL_TABLET | Freq: Every day | ORAL | 3 refills | Status: DC
  Filled 2024-06-23: qty 45, 45d supply, fill #0

## 2024-06-23 NOTE — Addendum Note (Signed)
 Addended by: TRIXIE FILE on: 06/23/2024 10:50 AM   Modules accepted: Orders

## 2024-08-20 ENCOUNTER — Other Ambulatory Visit: Payer: Self-pay | Admitting: Internal Medicine

## 2024-08-20 ENCOUNTER — Other Ambulatory Visit

## 2024-08-20 DIAGNOSIS — E063 Autoimmune thyroiditis: Secondary | ICD-10-CM

## 2024-08-20 NOTE — Progress Notes (Signed)
 Orders Faxed to [facility] [phone]

## 2024-08-21 ENCOUNTER — Other Ambulatory Visit

## 2024-08-21 DIAGNOSIS — E063 Autoimmune thyroiditis: Secondary | ICD-10-CM | POA: Diagnosis not present

## 2024-08-21 LAB — T4, FREE: Free T4: 1.4 ng/dL (ref 0.8–1.8)

## 2024-08-21 LAB — TSH: TSH: 0.47 m[IU]/L

## 2024-08-22 ENCOUNTER — Other Ambulatory Visit: Payer: Self-pay

## 2024-08-22 ENCOUNTER — Other Ambulatory Visit (HOSPITAL_COMMUNITY): Payer: Self-pay

## 2024-08-22 ENCOUNTER — Other Ambulatory Visit (HOSPITAL_BASED_OUTPATIENT_CLINIC_OR_DEPARTMENT_OTHER): Payer: Self-pay

## 2024-08-22 ENCOUNTER — Other Ambulatory Visit: Payer: Self-pay | Admitting: Internal Medicine

## 2024-08-22 DIAGNOSIS — E038 Other specified hypothyroidism: Secondary | ICD-10-CM

## 2024-08-22 DIAGNOSIS — E063 Autoimmune thyroiditis: Secondary | ICD-10-CM

## 2024-08-22 MED ORDER — LEVOTHYROXINE SODIUM 125 MCG PO TABS
125.0000 ug | ORAL_TABLET | Freq: Every day | ORAL | 5 refills | Status: AC
  Filled 2024-08-22: qty 45, 45d supply, fill #0

## 2024-09-05 DIAGNOSIS — H5213 Myopia, bilateral: Secondary | ICD-10-CM | POA: Diagnosis not present

## 2024-12-23 ENCOUNTER — Ambulatory Visit: Admitting: Internal Medicine
